# Patient Record
Sex: Female | Born: 1970 | Race: White | Hispanic: No | Marital: Married | Smoking: Never smoker
Health system: Southern US, Community
[De-identification: ages and names within clinical notes are randomized; demographics above are authoritative.]

## PROBLEM LIST (undated history)

## (undated) DIAGNOSIS — C4490 Unspecified malignant neoplasm of skin, unspecified: Secondary | ICD-10-CM

## (undated) DIAGNOSIS — F32A Depression, unspecified: Secondary | ICD-10-CM

## (undated) DIAGNOSIS — N809 Endometriosis, unspecified: Secondary | ICD-10-CM

## (undated) DIAGNOSIS — F514 Sleep terrors [night terrors]: Secondary | ICD-10-CM

## (undated) DIAGNOSIS — F410 Panic disorder [episodic paroxysmal anxiety] without agoraphobia: Secondary | ICD-10-CM

## (undated) DIAGNOSIS — F419 Anxiety disorder, unspecified: Secondary | ICD-10-CM

## (undated) DIAGNOSIS — IMO0002 Reserved for concepts with insufficient information to code with codable children: Secondary | ICD-10-CM

## (undated) DIAGNOSIS — F4310 Post-traumatic stress disorder, unspecified: Secondary | ICD-10-CM

## (undated) DIAGNOSIS — F449 Dissociative and conversion disorder, unspecified: Secondary | ICD-10-CM

## (undated) DIAGNOSIS — F329 Major depressive disorder, single episode, unspecified: Secondary | ICD-10-CM

## (undated) DIAGNOSIS — M259 Joint disorder, unspecified: Secondary | ICD-10-CM

## (undated) DIAGNOSIS — G43909 Migraine, unspecified, not intractable, without status migrainosus: Secondary | ICD-10-CM

## (undated) DIAGNOSIS — M779 Enthesopathy, unspecified: Secondary | ICD-10-CM

## (undated) DIAGNOSIS — M5430 Sciatica, unspecified side: Secondary | ICD-10-CM

## (undated) HISTORY — DX: Migraine, unspecified, not intractable, without status migrainosus: G43.909

## (undated) HISTORY — DX: Post-traumatic stress disorder, unspecified: F43.10

## (undated) HISTORY — DX: Sciatica, unspecified side: M54.30

## (undated) HISTORY — DX: Depression, unspecified: F32.A

## (undated) HISTORY — DX: Major depressive disorder, single episode, unspecified: F32.9

## (undated) HISTORY — DX: Joint disorder, unspecified: M25.9

## (undated) HISTORY — DX: Dissociative and conversion disorder, unspecified: F44.9

## (undated) HISTORY — DX: Panic disorder (episodic paroxysmal anxiety): F41.0

## (undated) HISTORY — PX: HAND SURGERY: SHX662

## (undated) HISTORY — DX: Anxiety disorder, unspecified: F41.9

## (undated) HISTORY — DX: Endometriosis, unspecified: N80.9

## (undated) HISTORY — DX: Sleep terrors (night terrors): F51.4

## (undated) HISTORY — DX: Reserved for concepts with insufficient information to code with codable children: IMO0002

## (undated) HISTORY — DX: Unspecified malignant neoplasm of skin, unspecified: C44.90

## (undated) HISTORY — DX: Enthesopathy, unspecified: M77.9

---

## 1998-11-23 ENCOUNTER — Other Ambulatory Visit: Admission: RE | Admit: 1998-11-23 | Discharge: 1998-11-23 | Payer: Self-pay | Admitting: Obstetrics & Gynecology

## 1999-12-08 ENCOUNTER — Other Ambulatory Visit: Admission: RE | Admit: 1999-12-08 | Discharge: 1999-12-08 | Payer: Self-pay | Admitting: Obstetrics & Gynecology

## 2000-04-04 HISTORY — PX: TUBAL LIGATION: SHX77

## 2000-04-25 ENCOUNTER — Inpatient Hospital Stay (HOSPITAL_COMMUNITY): Admission: AD | Admit: 2000-04-25 | Discharge: 2000-04-25 | Payer: Self-pay | Admitting: Obstetrics and Gynecology

## 2000-06-08 ENCOUNTER — Inpatient Hospital Stay (HOSPITAL_COMMUNITY): Admission: AD | Admit: 2000-06-08 | Discharge: 2000-06-08 | Payer: Self-pay | Admitting: Obstetrics and Gynecology

## 2000-06-16 ENCOUNTER — Inpatient Hospital Stay (HOSPITAL_COMMUNITY): Admission: AD | Admit: 2000-06-16 | Discharge: 2000-06-19 | Payer: Self-pay | Admitting: Obstetrics and Gynecology

## 2000-07-27 ENCOUNTER — Other Ambulatory Visit: Admission: RE | Admit: 2000-07-27 | Discharge: 2000-07-27 | Payer: Self-pay | Admitting: Obstetrics & Gynecology

## 2001-08-30 ENCOUNTER — Other Ambulatory Visit: Admission: RE | Admit: 2001-08-30 | Discharge: 2001-08-30 | Payer: Self-pay | Admitting: Obstetrics & Gynecology

## 2002-03-16 ENCOUNTER — Inpatient Hospital Stay (HOSPITAL_COMMUNITY): Admission: AD | Admit: 2002-03-16 | Discharge: 2002-03-16 | Payer: Self-pay | Admitting: Obstetrics and Gynecology

## 2002-03-16 ENCOUNTER — Encounter: Payer: Self-pay | Admitting: Obstetrics and Gynecology

## 2003-01-03 ENCOUNTER — Emergency Department (HOSPITAL_COMMUNITY): Admission: EM | Admit: 2003-01-03 | Discharge: 2003-01-03 | Payer: Self-pay | Admitting: Emergency Medicine

## 2003-01-14 ENCOUNTER — Other Ambulatory Visit: Admission: RE | Admit: 2003-01-14 | Discharge: 2003-01-14 | Payer: Self-pay | Admitting: Obstetrics & Gynecology

## 2003-01-14 ENCOUNTER — Inpatient Hospital Stay (HOSPITAL_COMMUNITY): Admission: EM | Admit: 2003-01-14 | Discharge: 2003-01-15 | Payer: Self-pay | Admitting: Psychiatry

## 2003-01-20 ENCOUNTER — Other Ambulatory Visit (HOSPITAL_COMMUNITY): Admission: RE | Admit: 2003-01-20 | Discharge: 2003-04-20 | Payer: Self-pay | Admitting: Psychiatry

## 2003-04-21 ENCOUNTER — Encounter: Payer: Self-pay | Admitting: Emergency Medicine

## 2003-04-21 ENCOUNTER — Inpatient Hospital Stay (HOSPITAL_COMMUNITY): Admission: EM | Admit: 2003-04-21 | Discharge: 2003-04-23 | Payer: Self-pay | Admitting: Psychiatry

## 2003-07-29 ENCOUNTER — Other Ambulatory Visit (HOSPITAL_COMMUNITY): Admission: RE | Admit: 2003-07-29 | Discharge: 2003-08-12 | Payer: Self-pay | Admitting: Psychiatry

## 2004-04-19 ENCOUNTER — Other Ambulatory Visit: Admission: RE | Admit: 2004-04-19 | Discharge: 2004-04-19 | Payer: Self-pay | Admitting: Obstetrics & Gynecology

## 2004-04-19 ENCOUNTER — Other Ambulatory Visit: Admission: RE | Admit: 2004-04-19 | Discharge: 2004-04-19 | Payer: Self-pay | Admitting: Obstetrics and Gynecology

## 2005-05-11 ENCOUNTER — Other Ambulatory Visit: Admission: RE | Admit: 2005-05-11 | Discharge: 2005-05-11 | Payer: Self-pay | Admitting: Obstetrics & Gynecology

## 2006-03-20 ENCOUNTER — Emergency Department (HOSPITAL_COMMUNITY): Admission: EM | Admit: 2006-03-20 | Discharge: 2006-03-20 | Payer: Self-pay | Admitting: Emergency Medicine

## 2006-03-21 ENCOUNTER — Emergency Department (HOSPITAL_COMMUNITY): Admission: EM | Admit: 2006-03-21 | Discharge: 2006-03-21 | Payer: Self-pay | Admitting: Emergency Medicine

## 2007-10-09 ENCOUNTER — Emergency Department (HOSPITAL_COMMUNITY): Admission: EM | Admit: 2007-10-09 | Discharge: 2007-10-09 | Payer: Self-pay | Admitting: Emergency Medicine

## 2007-10-24 ENCOUNTER — Encounter: Admission: RE | Admit: 2007-10-24 | Discharge: 2007-10-24 | Payer: Self-pay | Admitting: Obstetrics & Gynecology

## 2009-01-12 ENCOUNTER — Emergency Department (HOSPITAL_COMMUNITY): Admission: EM | Admit: 2009-01-12 | Discharge: 2009-01-12 | Payer: Self-pay | Admitting: Emergency Medicine

## 2009-04-04 HISTORY — PX: RADICAL HYSTERECTOMY: SHX2283

## 2009-09-22 ENCOUNTER — Other Ambulatory Visit: Admission: RE | Admit: 2009-09-22 | Discharge: 2009-09-22 | Payer: Self-pay | Admitting: Radiology

## 2010-04-04 HISTORY — PX: BREAST BIOPSY: SHX20

## 2010-04-25 ENCOUNTER — Encounter: Payer: Self-pay | Admitting: Specialist

## 2010-04-25 ENCOUNTER — Encounter: Payer: Self-pay | Admitting: Obstetrics & Gynecology

## 2010-07-18 ENCOUNTER — Emergency Department (HOSPITAL_COMMUNITY)
Admission: EM | Admit: 2010-07-18 | Discharge: 2010-07-18 | Disposition: A | Discharge status: Home / Self Care | Payer: Medicare Other | Attending: Emergency Medicine | Admitting: Emergency Medicine

## 2010-07-18 DIAGNOSIS — J3489 Other specified disorders of nose and nasal sinuses: Secondary | ICD-10-CM | POA: Insufficient documentation

## 2010-07-18 DIAGNOSIS — R51 Headache: Secondary | ICD-10-CM | POA: Insufficient documentation

## 2010-07-18 DIAGNOSIS — Z79899 Other long term (current) drug therapy: Secondary | ICD-10-CM | POA: Insufficient documentation

## 2010-07-18 DIAGNOSIS — H53149 Visual discomfort, unspecified: Secondary | ICD-10-CM | POA: Insufficient documentation

## 2010-07-18 DIAGNOSIS — R070 Pain in throat: Secondary | ICD-10-CM | POA: Insufficient documentation

## 2010-07-18 DIAGNOSIS — M542 Cervicalgia: Secondary | ICD-10-CM | POA: Insufficient documentation

## 2010-07-18 DIAGNOSIS — J329 Chronic sinusitis, unspecified: Secondary | ICD-10-CM | POA: Insufficient documentation

## 2010-07-18 DIAGNOSIS — F3130 Bipolar disorder, current episode depressed, mild or moderate severity, unspecified: Secondary | ICD-10-CM | POA: Insufficient documentation
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
## 2010-08-20 NOTE — Discharge Summary (Signed)
NAMEZIARE, ORRICK                          ACCOUNT NO.:  [id number]   MEDICAL RECORD NO.:  [id number]                   PATIENT TYPE:  IPS   LOCATION:  0503                                 FACILITY:  [facility]   PHYSICIAN:  Geoffery Lyons, M.D.                   DATE OF BIRTH:  19-Nov-1970   DATE OF ADMISSION:  04/21/2003  DATE OF DISCHARGE:  04/23/2003                                 DISCHARGE SUMMARY   CHIEF COMPLAINT AND PRESENT ILLNESS:  This was the second admission to [facility] for this 40 year old separated white female  involuntarily committed.  She apparently was taken to the emergency room.  She stated there that she was pregnant when she was not.  Tried to leave the  emergency room.  She claimed that she feel and hit her head the day before.  She tripped.  She lost consciousness.  She was apparently taken to the  emergency room where she said she was pregnant.  Told them that it was 2001.  She was further evaluated and there were no positive findings.  Apparently,  she was under a lot of stress, getting a divorce, in a same-sex  relationship, 42 year old daughter is giving her a hard time, will not talk  with her.  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   PAST PSYCHIATRIC HISTORY:  Second time at [facility].  Admitted  January 06, 2003.  Depression, panic attacks.  Increased much since in [facility].   ALCOHOL/DRUG HISTORY:  Drinks alcohol.  Admits to use of marijuana.   PAST MEDICAL HISTORY:  Neurofibromatosis, migraines.   MEDICATIONS:  Topamax (off since December), Ambien, Xanax 0.5 mg three times  a day, Cymbalta 60 mg per day (since October).   PHYSICAL EXAMINATION:  Performed and failed to show any acute findings.   MENTAL STATUS EXAM:  Alert, cooperative female with good eye contact.  Speech was clear.  Goal-oriented.  Normal tempo and production.  Mood  anxious while being in the hospital.  Affect anxious, wanting to leave.  Thought processes  logical, coherent and relevant.  No evidence of delusional  ideas, although admits that she said she was pregnant.  Cognition well-  preserved.   ADMISSION DIAGNOSES:   AXIS I:  1. Rule out mood disorder not otherwise specified.  2. Rule out marijuana and alcohol abuse.  3. Rule out psychotic disorder not otherwise specified.   AXIS II:  No diagnosis.   AXIS III:  1. Migraines.  2. Neurofibromatosis.   AXIS IV:  Moderate.   AXIS V:  Global Assessment of Functioning upon admission 30; highest Global  Assessment of Functioning in the last year 70.   LABORATORY DATA:  Head CT scan was negative.  Drug screening positive for  benzodiazepines and marijuana.  Urine pregnancy test was negative.  Other  laboratory values were within normal limits.  HOSPITAL COURSE:  She was admitted and started intensive individual and  group psychotherapy.  She was given Ambien for sleep.  She was maintained on  the Cymbalta 30 mg per day and then increased to 60 mg, the amount she was  taking.  She was very upset because she was in the hospital.  York Spaniel she did  not need to stay.  Continued to insist that she just tripped, hit her head.  Admits that she had a difficult time during Christmas but she went to [facility] and indeed she was doing much better.  She was able to share her  stressors, financial difficulties, issues with the divorce and the daughter  and the partner.  She was able to work on Conservation officer, historic buildings, stress  management and, on April 23, 2003, she was in full contact with reality.  There were no suicidal ideation, no homicidal ideation, no hallucinations,  no delusions.  She felt she was back to herself.  Denied the use of  substances.  We went ahead and discharged to outpatient follow-up.   DISCHARGE DIAGNOSES:   AXIS I:  1. Mood disorder not otherwise specified.  2. Rule out alcohol and marijuana abuse.   AXIS II:  No diagnosis.   AXIS III:  1. Migraines.  2.  Neurofibromatosis.   AXIS IV:  Moderate.   AXIS V:  Global Assessment of Functioning upon discharge 50.   DISCHARGE MEDICATIONS:  Cymbalta 60 mg per day.   FOLLOW UP:  [facility].                                               Geoffery Lyons, M.D.    IL/MEDQ  D:  05/07/2003  T:  05/07/2003  Job:  [id number]
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
## 2011-03-10 ENCOUNTER — Other Ambulatory Visit: Payer: Self-pay | Admitting: Geriatric Medicine

## 2011-03-10 DIAGNOSIS — M545 Low back pain: Secondary | ICD-10-CM

## 2011-03-22 ENCOUNTER — Inpatient Hospital Stay: Admission: RE | Admit: 2011-03-22 | Payer: Medicare Other | Source: Ambulatory Visit

## 2011-05-11 ENCOUNTER — Ambulatory Visit (INDEPENDENT_AMBULATORY_CARE_PROVIDER_SITE_OTHER): Payer: 59 | Admitting: Family Medicine

## 2011-05-11 ENCOUNTER — Ambulatory Visit: Payer: 59

## 2011-05-11 VITALS — BP 120/72 | HR 76 | Temp 98.3°F | Resp 16 | Ht 64.5 in | Wt 164.2 lb

## 2011-05-11 DIAGNOSIS — M79671 Pain in right foot: Secondary | ICD-10-CM

## 2011-05-11 DIAGNOSIS — M79609 Pain in unspecified limb: Secondary | ICD-10-CM

## 2011-05-11 DIAGNOSIS — B078 Other viral warts: Secondary | ICD-10-CM

## 2011-05-11 DIAGNOSIS — B070 Plantar wart: Secondary | ICD-10-CM

## 2011-05-11 MED ORDER — MELOXICAM 7.5 MG PO TABS: ORAL_TABLET | ORAL | Status: DC

## 2011-05-11 NOTE — Progress Notes (Signed)
  Subjective:    Patient ID: [name], female    DOB: 07/16/1970, 41 y.o.   MRN: [id number]  HPI  (R) foot pain, progressive over the last month.  Now very difficult to bear wieight.  Noted lesion on plantar surface of (L) foot.  Patient tried to remove lesion herself without success.   Review of Systems     Objective:   Physical Exam  Constitutional: She appears well-developed and well-nourished.  Cardiovascular: Normal rate, regular rhythm and normal heart sounds.   Pulmonary/Chest: Effort normal and breath sounds normal.  Musculoskeletal:       Right foot: She exhibits tenderness (wart on plantar surface of left foot  ).    [facility] reading (PRIMARY) by  Dr. Hal Hope No fracture visible      Assessment & Plan:  Large plantar wart- (R) foot  Plantar wart paired down Mobic 7.5 mg BID prn  F/U with R. Dunn PAC 1 week

## 2011-05-11 NOTE — Progress Notes (Signed)
   Procedure Note for Cryotherapy:  Verbal consent obtained. Foot soaked in warm water bath. Lesion along right foot paired down with 10 blade. Two applications of cryotherapy applied to lesion. Lesion dressed. Wound care covered.  RTC in 10 days for repeat treatment. Patient tolerated procedure well.  Signed,  Eula Listen, PA-C 05/11/2011, 3:41 PM

## 2011-05-21 ENCOUNTER — Ambulatory Visit (INDEPENDENT_AMBULATORY_CARE_PROVIDER_SITE_OTHER): Payer: 59 | Admitting: Physician Assistant

## 2011-05-21 VITALS — BP 107/72 | HR 72 | Temp 97.7°F | Resp 16 | Ht 63.5 in | Wt 163.0 lb

## 2011-05-21 DIAGNOSIS — M79609 Pain in unspecified limb: Secondary | ICD-10-CM

## 2011-05-21 DIAGNOSIS — B078 Other viral warts: Secondary | ICD-10-CM

## 2011-05-21 DIAGNOSIS — B070 Plantar wart: Secondary | ICD-10-CM

## 2011-05-21 DIAGNOSIS — M79673 Pain in unspecified foot: Secondary | ICD-10-CM

## 2011-05-21 MED ORDER — HYDROCODONE-ACETAMINOPHEN 5-325 MG PO TABS: 1.0000 | ORAL_TABLET | Freq: Four times a day (QID) | ORAL | Status: AC | PRN

## 2011-05-21 NOTE — Progress Notes (Signed)
   Patient ID: [name] MRN: [id number], DOB: 22-Aug-1970, 41 y.o. Date of Encounter: 05/21/2011, 8:29 AM  Primary Physician: No primary provider on file.  Chief Complaint: Follow up plantar wart  HPI: 41 y.o. year old female with history below presents for follow up cryotherapy of planta wart of right foot. Complains of pain with walking. She states that she does not have a car, which requires her to walk everywhere. Otherwise she is doing well.    No past medical history on file.   Home Meds: Prior to Admission medications   Medication Sig Start Date End Date Taking? Authorizing Provider  meloxicam (MOBIC) 7.5 MG tablet 1 po daily to bid prn pain 05/11/11  Yes Eula Listen, PA-C    Allergies:  Allergies  Allergen Reactions  . Benadryl (Altaryl)     History   Social History  . Marital Status: Married    Spouse Name: N/A    Number of Children: N/A  . Years of Education: N/A   Occupational History  . Not on file.   Social History Main Topics  . Smoking status: Never Smoker   . Smokeless tobacco: Not on file  . Alcohol Use: Not on file  . Drug Use: Not on file  . Sexually Active: Not on file   Other Topics Concern  . Not on file   Social History Narrative  . No narrative on file     Review of Systems: Constitutional: negative for chills, fever, night sweats, weight changes, or fatigue  HEENT: negative for vision changes, hearing loss, congestion, rhinorrhea, ST, epistaxis, or sinus pressure Cardiovascular: negative for chest pain or palpitations Respiratory: negative for hemoptysis, wheezing, shortness of breath, or cough Abdominal: negative for abdominal pain, nausea, vomiting, diarrhea, or constipation Dermatological: negative for rash Neurologic: negative for headache, dizziness, or syncope All other systems reviewed and are otherwise negative with the exception to those above and in the HPI.   Physical Exam: Blood pressure 107/72, pulse 72, temperature 97.7  F (36.5 C), temperature source Oral, resp. rate 16, height 5' 3.5" (1.613 m), weight 163 lb (73.936 kg)., Body mass index is 28.42 kg/(m^2). General: Well developed, well nourished, in no acute distress. Head: Normocephalic, atraumatic, eyes without discharge, sclera non-icteric, nares are without discharge. Oral cavity moist.  Neck: Supple. FROM Lungs: Breathing is unlabored. Heart: Regular rate Msk:  Strength and tone normal for age. Extremities/Skin: Warm and dry. No clubbing or cyanosis. No edema. No rashes or suspicious lesions. Neuro: Alert and oriented X 3. Moves all extremities spontaneously. Gait is normal. CNII-XII grossly in tact. Psych:  Responds to questions appropriately with a normal affect.     Procedure Note for Cryotherapy:  Verbal consent obtained. Foot soaked in warm water bath. Lesion along right plantar foot paired down with 10 blade. Two applications of cryotherapy applied to lesion. Lesion dressed. Wound care covered.  RTC in 7 days Patient tolerated procedure well.    ASSESSMENT AND PLAN:  41 y.o. year old female with plantar wart right foot. -Cryotherapy per above -Norco 5/325 mg #30 1 po q 6 hours prn pain no RF SED -Recheck 1 week -RTC precautions   Signed, Eula Listen, PA-C 05/21/2011 8:29 AM

## 2011-05-28 ENCOUNTER — Ambulatory Visit (INDEPENDENT_AMBULATORY_CARE_PROVIDER_SITE_OTHER): Payer: 59 | Admitting: Physician Assistant

## 2011-05-28 VITALS — BP 107/71 | HR 71 | Temp 97.8°F | Resp 16 | Ht 65.5 in | Wt 169.0 lb

## 2011-05-28 DIAGNOSIS — A630 Anogenital (venereal) warts: Secondary | ICD-10-CM

## 2011-05-28 DIAGNOSIS — B070 Plantar wart: Secondary | ICD-10-CM

## 2011-05-28 NOTE — Progress Notes (Signed)
Patient ID: [name] MRN: [id number], DOB: 1970/11/10, 41 y.o. Date of Encounter: 05/28/2011, 10:15 AM  Primary Physician: No primary provider on file.  Chief Complaint: Follow up cryotherapy   HPI: 41 y.o. year old female with history below presents for follow up cryotherapy of plantar wart of right foot. Pain improving when she walks. Developed a blister after last session. 3 days prior this ruptured causing a considerable improvement with her pain when ambulating. No erythema, swelling, or pain. Afebrile.   No past medical history on file.   Home Meds: Prior to Admission medications   Medication Sig Start Date End Date Taking? Authorizing Provider  HYDROcodone-acetaminophen (NORCO) 5-325 MG per tablet Take 1 tablet by mouth every 6 (six) hours as needed for pain. 05/21/11 05/31/11 Yes Sobia Karger, PA-C  meloxicam (MOBIC) 7.5 MG tablet 1 po daily to bid prn pain 05/11/11  Yes Eula Listen, PA-C    Allergies:  Allergies  Allergen Reactions  . Benadryl (Altaryl)     History   Social History  . Marital Status: Married    Spouse Name: N/A    Number of Children: N/A  . Years of Education: N/A   Occupational History  . Not on file.   Social History Main Topics  . Smoking status: Never Smoker   . Smokeless tobacco: Not on file  . Alcohol Use: Not on file  . Drug Use: Not on file  . Sexually Active: Not on file   Other Topics Concern  . Not on file   Social History Narrative  . No narrative on file     Review of Systems: Constitutional: negative for chills, fever, night sweats, weight changes, or fatigue  HEENT: negative for vision changes, hearing loss, congestion, rhinorrhea, ST, epistaxis, or sinus pressure Cardiovascular: negative for chest pain or palpitations Respiratory: negative for hemoptysis, wheezing, shortness of breath, or cough Abdominal: negative for abdominal pain, nausea, vomiting, diarrhea, or constipation Dermatological: negative for rash Neurologic:  negative for headache, dizziness, or syncope All other systems reviewed and are otherwise negative with the exception to those above and in the HPI.   Physical Exam: Blood pressure 107/71, pulse 71, temperature 97.8 F (36.6 C), temperature source Oral, resp. rate 16, height 5' 5.5" (1.664 m), weight 169 lb (76.658 kg)., Body mass index is 27.70 kg/(m^2). General: Well developed, well nourished, in no acute distress. Head: Normocephalic, atraumatic, eyes without discharge, sclera non-icteric, nares are without discharge. Bilateral auditory canals clear, TM's are without perforation, pearly grey and translucent with reflective cone of light bilaterally. Oral cavity moist, posterior pharynx without exudate, erythema, peritonsillar abscess, or post nasal drip.  Neck: Supple. No thyromegaly. Full ROM. No lymphadenopathy. Lungs: Breathing is unlabored. Heart: Normal rate. Msk:  Strength and tone normal for age. Extremities/Skin: Warm and dry. No clubbing or cyanosis. No edema. No rashes or suspicious lesions. Resolving plantar wart right foot. No erythema. Mild TTP superior aspect.  Neuro: Alert and oriented X 3. Moves all extremities spontaneously. Gait is normal. CNII-XII grossly in tact. Psych:  Responds to questions appropriately with a normal affect.     Procedure Note for Cryotherapy:  Verbal consent obtained. Foot soaked in warm water bath. Lesion along right foot paired down with 10 blade. Two applications of cryotherapy applied to lesion. Lesion dressed. Wound care covered.  RTC in 1 week if not resolved Patient tolerated procedure well.   ASSESSMENT AND PLAN:  41 y.o. year old female with resolving plantar wart right foot. -Cryotherapy per  above -RTC 1 week if not resolved -RTC precautions  Signed, Eula Listen, PA-C 05/28/2011 10:15 AM

## 2011-06-04 ENCOUNTER — Ambulatory Visit (INDEPENDENT_AMBULATORY_CARE_PROVIDER_SITE_OTHER): Payer: 59 | Admitting: Physician Assistant

## 2011-06-04 VITALS — BP 124/77 | HR 74 | Temp 98.5°F | Resp 16 | Ht 65.5 in | Wt 169.0 lb

## 2011-06-04 DIAGNOSIS — M79609 Pain in unspecified limb: Secondary | ICD-10-CM

## 2011-06-04 NOTE — Progress Notes (Signed)
  Subjective:    Patient ID: [name], female    DOB: [birth year], 41 y.o.   MRN: [id number]  HPI REcheck plantar's wart.  Frozen and curretted X 1 week ago. Having to walk everywhere bc no car.   Review of Systems  All other systems reviewed and are negative.       Objective:   Physical Exam R foot plantar aspect. Inflamed skin w central ulceration where the wart body was.          Assessment & Plan:  Weight displacement bandages.  Begin compound-w once healed.

## 2012-08-07 ENCOUNTER — Institutional Professional Consult (permissible substitution): Payer: Medicare Other | Admitting: Diagnostic Neuroimaging

## 2012-08-22 ENCOUNTER — Institutional Professional Consult (permissible substitution): Payer: Medicare Other | Admitting: Diagnostic Neuroimaging

## 2012-09-21 ENCOUNTER — Other Ambulatory Visit: Payer: Self-pay | Admitting: Obstetrics & Gynecology

## 2012-09-21 DIAGNOSIS — N644 Mastodynia: Secondary | ICD-10-CM

## 2012-10-03 ENCOUNTER — Ambulatory Visit
Admission: RE | Admit: 2012-10-03 | Discharge: 2012-10-03 | Disposition: A | Discharge status: Home / Self Care | Payer: 59 | Source: Ambulatory Visit | Attending: Obstetrics & Gynecology | Admitting: Obstetrics & Gynecology

## 2012-10-03 DIAGNOSIS — N644 Mastodynia: Secondary | ICD-10-CM

## 2012-10-23 ENCOUNTER — Ambulatory Visit: Payer: Medicare Other | Admitting: Diagnostic Neuroimaging

## 2012-11-09 ENCOUNTER — Ambulatory Visit (INDEPENDENT_AMBULATORY_CARE_PROVIDER_SITE_OTHER): Payer: 59 | Admitting: Diagnostic Neuroimaging

## 2012-11-09 ENCOUNTER — Encounter: Payer: Self-pay | Admitting: Diagnostic Neuroimaging

## 2012-11-09 VITALS — BP 101/78 | HR 81 | Temp 98.2°F | Ht 65.5 in | Wt 169.0 lb

## 2012-11-09 DIAGNOSIS — Q8500 Neurofibromatosis, unspecified: Secondary | ICD-10-CM | POA: Insufficient documentation

## 2012-11-09 NOTE — Patient Instructions (Signed)
I will refer you to [facility] clinic.
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
## 2012-11-13 ENCOUNTER — Telehealth: Payer: Self-pay | Admitting: Diagnostic Neuroimaging

## 2012-11-16 ENCOUNTER — Telehealth: Payer: Self-pay | Admitting: Diagnostic Neuroimaging

## 2012-11-16 NOTE — Telephone Encounter (Signed)
Patient called to check the status of referral to [facility]. Explained process.

## 2013-04-30 ENCOUNTER — Other Ambulatory Visit: Payer: Self-pay

## 2013-05-13 ENCOUNTER — Ambulatory Visit: Payer: Medicare Other | Admitting: Diagnostic Neuroimaging

## 2013-06-18 ENCOUNTER — Other Ambulatory Visit: Payer: Self-pay | Admitting: Obstetrics and Gynecology

## 2013-06-18 DIAGNOSIS — N644 Mastodynia: Secondary | ICD-10-CM

## 2013-06-27 ENCOUNTER — Ambulatory Visit
Admission: RE | Admit: 2013-06-27 | Discharge: 2013-06-27 | Disposition: A | Discharge status: Home / Self Care | Payer: 59 | Source: Ambulatory Visit | Attending: Obstetrics and Gynecology | Admitting: Obstetrics and Gynecology

## 2013-06-27 DIAGNOSIS — N644 Mastodynia: Secondary | ICD-10-CM

## 2013-07-23 ENCOUNTER — Ambulatory Visit: Payer: 59 | Admitting: Diagnostic Neuroimaging

## 2014-03-24 ENCOUNTER — Telehealth: Payer: Self-pay | Admitting: *Deleted

## 2014-03-24 NOTE — Telephone Encounter (Signed)
Open error 

## 2015-02-08 ENCOUNTER — Emergency Department (HOSPITAL_COMMUNITY): Payer: Medicare Other

## 2015-02-08 ENCOUNTER — Emergency Department (HOSPITAL_COMMUNITY)
Admission: EM | Admit: 2015-02-08 | Discharge: 2015-02-09 | Disposition: A | Discharge status: Home / Self Care | Payer: Medicare Other | Attending: Physician Assistant | Admitting: Physician Assistant

## 2015-02-08 ENCOUNTER — Encounter (HOSPITAL_COMMUNITY): Payer: Self-pay | Admitting: Nurse Practitioner

## 2015-02-08 DIAGNOSIS — Z8742 Personal history of other diseases of the female genital tract: Secondary | ICD-10-CM | POA: Diagnosis not present

## 2015-02-08 DIAGNOSIS — Z85828 Personal history of other malignant neoplasm of skin: Secondary | ICD-10-CM | POA: Diagnosis not present

## 2015-02-08 DIAGNOSIS — J111 Influenza due to unidentified influenza virus with other respiratory manifestations: Secondary | ICD-10-CM | POA: Diagnosis not present

## 2015-02-08 DIAGNOSIS — G43809 Other migraine, not intractable, without status migrainosus: Secondary | ICD-10-CM | POA: Diagnosis not present

## 2015-02-08 DIAGNOSIS — Z8739 Personal history of other diseases of the musculoskeletal system and connective tissue: Secondary | ICD-10-CM | POA: Insufficient documentation

## 2015-02-08 DIAGNOSIS — Z8659 Personal history of other mental and behavioral disorders: Secondary | ICD-10-CM | POA: Diagnosis not present

## 2015-02-08 DIAGNOSIS — Z87891 Personal history of nicotine dependence: Secondary | ICD-10-CM | POA: Diagnosis not present

## 2015-02-08 DIAGNOSIS — R52 Pain, unspecified: Secondary | ICD-10-CM | POA: Diagnosis present

## 2015-02-08 DIAGNOSIS — R69 Illness, unspecified: Secondary | ICD-10-CM

## 2015-02-08 LAB — RAPID STREP SCREEN (MED CTR MEBANE ONLY): Streptococcus, Group A Screen (Direct): NEGATIVE

## 2015-02-08 MED ORDER — KETOROLAC TROMETHAMINE 30 MG/ML IJ SOLN
30.0000 mg | Freq: Once | INTRAMUSCULAR | Status: AC
  Administered 2015-02-08: 30 mg via INTRAVENOUS
  Filled 2015-02-08: qty 1

## 2015-02-08 MED ORDER — PROCHLORPERAZINE EDISYLATE 5 MG/ML IJ SOLN
10.0000 mg | Freq: Once | INTRAMUSCULAR | Status: AC
  Administered 2015-02-08: 10 mg via INTRAVENOUS
  Filled 2015-02-08: qty 2

## 2015-02-08 MED ORDER — GUAIFENESIN ER 1200 MG PO TB12: 1.0000 | ORAL_TABLET | Freq: Two times a day (BID) | ORAL | Status: DC

## 2015-02-08 MED ORDER — IBUPROFEN 800 MG PO TABS: 800.0000 mg | ORAL_TABLET | Freq: Three times a day (TID) | ORAL | Status: DC | PRN

## 2015-02-08 MED ORDER — SODIUM CHLORIDE 0.9 % IV BOLUS (SEPSIS)
1000.0000 mL | Freq: Once | INTRAVENOUS | Status: AC
  Administered 2015-02-08: 1000 mL via INTRAVENOUS

## 2015-02-08 MED ORDER — ACETAMINOPHEN-CODEINE 120-12 MG/5ML PO SOLN: 10.0000 mL | ORAL | Status: DC | PRN

## 2015-02-08 NOTE — ED Notes (Signed)
Pt c/o N/V, migraine headache, sore throat and generalized body aches, onset yesterday afternoon, has tried OTC remedies without getting relief.

## 2015-02-08 NOTE — ED Provider Notes (Signed)
CSN: [id number]     Arrival date & time 02/08/15  1932 History   First MD Initiated Contact with Patient 02/08/15 1954     Chief Complaint  Patient presents with  . Migraine  . N/V   . Generalized Body Aches     (Consider location/radiation/quality/duration/timing/severity/associated sxs/prior Treatment) HPI Patient presents to the emergency department with migraine headache, cough, runny nose, sore throat and light sensitivity.  The patient states that she also had some nausea and vomiting.  Patient states she took Goody's powder before coming to the emergency department with no relief of her symptoms.  The patient states that she does not have any chest pain, shortness of breath, weakness, dizziness, blurred vision, neck pain, numbness, dysuria, incontinence, rash, abdominal pain, hematuria, bloody stool, hematemesis, or syncope.  The patient states that she took no other medications prior to arrival.  She said nothing seems make her condition better or worse Past Medical History  Diagnosis Date  . Depression   . PTSD (post-traumatic stress disorder)   . Night terrors   . Dissociative disorder   . Panic disorder (episodic paroxysmal anxiety)   . Bulging discs     neck/back  . Sciatica   . Tendonitis   . Knee problem     Left  . Endometriosis   . Migraine   . Anxiety   . Skin cancer    Past Surgical History  Procedure Laterality Date  . Tubal ligation  2002  . Radical hysterectomy  2011  . Hand surgery Left     Grafting  . Breast biopsy Left 2012   Family History  Problem Relation Age of Onset  . Cancer Mother   . Leukemia Father   . Esophageal cancer    . Lung cancer    . Stroke    . Hypertension    . ALS    . Macular degeneration     Social History  Substance Use Topics  . Smoking status: Former Smoker -- 0.20 packs/day for 6 years    Types: Cigarettes    Quit date: 04/05/2011  . Smokeless tobacco: Never Used  . Alcohol Use: No   OB History    No data  available     Review of Systems All other systems negative except as documented in the HPI. All pertinent positives and negatives as reviewed in the HPI.=   Allergies  Benadryl  Home Medications   Prior to Admission medications   Medication Sig Start Date End Date Taking? Authorizing Provider  ibuprofen (ADVIL,MOTRIN) 200 MG tablet Take 200 mg by mouth 3 (three) times daily as needed for pain.    Historical Provider, MD  meloxicam (MOBIC) 7.5 MG tablet 1 po daily to bid prn pain 05/11/11   Areta Haber Dunn, PA-C  zolpidem (AMBIEN) 10 MG tablet Take 10 mg by mouth at bedtime as needed for sleep.    Historical Provider, MD   BP 116/73 mmHg  Pulse 75  Temp(Src) 98.5 F (36.9 C) (Oral)  Resp 19  Ht 5\' 5"  (1.651 m)  Wt 160 lb (72.576 kg)  BMI 26.63 kg/m2  SpO2 98% Physical Exam  Constitutional: She is oriented to person, place, and time. She appears well-developed and well-nourished. No distress.  HENT:  Head: Normocephalic and atraumatic.  Mouth/Throat: Oropharynx is clear and moist.  Eyes: Pupils are equal, round, and reactive to light.  Neck: Normal range of motion. Neck supple.  Cardiovascular: Normal rate, regular rhythm and normal heart sounds.  Exam reveals no gallop and no friction rub.   No murmur heard. Pulmonary/Chest: Effort normal and breath sounds normal. No respiratory distress. She has no wheezes.  Abdominal: Soft. Bowel sounds are normal. She exhibits no distension. There is no tenderness.  Neurological: She is alert and oriented to person, place, and time. She exhibits normal muscle tone. Coordination normal.  Skin: Skin is warm and dry. No rash noted. No erythema.  Psychiatric: She has a normal mood and affect. Her behavior is normal.  Nursing note and vitals reviewed.   ED Course  Procedures (including critical care time) Labs Review Labs Reviewed - No data to display  Imaging Review No results found. I have personally reviewed and evaluated these images and  lab results as part of my medical decision-making.   Patient is feeling better following IV fluids and medications.  Patient will be discharged home, told to return here as needed.  Feel like the patient most likely has an upper respiratory flulike illness  Dalia Heading, PA-C 02/08/15 [place], MD 02/12/15 [phone]

## 2015-02-08 NOTE — ED Notes (Signed)
Pt states that she began yesterday with a sore throat that progressed to a sinus headache which progressed to her current migraine with N/V and light sensitivity.

## 2015-02-08 NOTE — Discharge Instructions (Signed)
Return here as needed.  Increase your fluid intake, rest as much as possible °

## 2015-02-08 NOTE — ED Notes (Signed)
Patient transported to X-ray 

## 2015-02-11 LAB — CULTURE, GROUP A STREP: Strep A Culture: NEGATIVE

## 2015-05-05 ENCOUNTER — Encounter (HOSPITAL_COMMUNITY): Payer: Self-pay | Admitting: Emergency Medicine

## 2015-05-05 ENCOUNTER — Emergency Department (HOSPITAL_COMMUNITY)
Admission: EM | Admit: 2015-05-05 | Discharge: 2015-05-05 | Disposition: A | Discharge status: Home / Self Care | Payer: Medicare Other | Attending: Physician Assistant | Admitting: Physician Assistant

## 2015-05-05 ENCOUNTER — Emergency Department (HOSPITAL_COMMUNITY): Payer: Medicare Other

## 2015-05-05 DIAGNOSIS — S199XXA Unspecified injury of neck, initial encounter: Secondary | ICD-10-CM | POA: Diagnosis not present

## 2015-05-05 DIAGNOSIS — S4991XA Unspecified injury of right shoulder and upper arm, initial encounter: Secondary | ICD-10-CM | POA: Diagnosis not present

## 2015-05-05 DIAGNOSIS — Y9389 Activity, other specified: Secondary | ICD-10-CM | POA: Diagnosis not present

## 2015-05-05 DIAGNOSIS — D1809 Hemangioma of other sites: Secondary | ICD-10-CM

## 2015-05-05 DIAGNOSIS — Z85828 Personal history of other malignant neoplasm of skin: Secondary | ICD-10-CM | POA: Insufficient documentation

## 2015-05-05 DIAGNOSIS — D1800 Hemangioma unspecified site: Secondary | ICD-10-CM | POA: Insufficient documentation

## 2015-05-05 DIAGNOSIS — Z8739 Personal history of other diseases of the musculoskeletal system and connective tissue: Secondary | ICD-10-CM | POA: Diagnosis not present

## 2015-05-05 DIAGNOSIS — IMO0002 Reserved for concepts with insufficient information to code with codable children: Secondary | ICD-10-CM

## 2015-05-05 DIAGNOSIS — Y998 Other external cause status: Secondary | ICD-10-CM | POA: Insufficient documentation

## 2015-05-05 DIAGNOSIS — Z8659 Personal history of other mental and behavioral disorders: Secondary | ICD-10-CM | POA: Insufficient documentation

## 2015-05-05 DIAGNOSIS — Z79899 Other long term (current) drug therapy: Secondary | ICD-10-CM | POA: Diagnosis not present

## 2015-05-05 DIAGNOSIS — S6991XA Unspecified injury of right wrist, hand and finger(s), initial encounter: Secondary | ICD-10-CM | POA: Diagnosis not present

## 2015-05-05 DIAGNOSIS — S6992XA Unspecified injury of left wrist, hand and finger(s), initial encounter: Secondary | ICD-10-CM | POA: Diagnosis not present

## 2015-05-05 DIAGNOSIS — G43909 Migraine, unspecified, not intractable, without status migrainosus: Secondary | ICD-10-CM | POA: Insufficient documentation

## 2015-05-05 DIAGNOSIS — Z8742 Personal history of other diseases of the female genital tract: Secondary | ICD-10-CM | POA: Insufficient documentation

## 2015-05-05 DIAGNOSIS — S8991XA Unspecified injury of right lower leg, initial encounter: Secondary | ICD-10-CM | POA: Diagnosis not present

## 2015-05-05 DIAGNOSIS — S8992XA Unspecified injury of left lower leg, initial encounter: Secondary | ICD-10-CM | POA: Diagnosis not present

## 2015-05-05 DIAGNOSIS — S4992XA Unspecified injury of left shoulder and upper arm, initial encounter: Secondary | ICD-10-CM | POA: Insufficient documentation

## 2015-05-05 DIAGNOSIS — Y92410 Unspecified street and highway as the place of occurrence of the external cause: Secondary | ICD-10-CM | POA: Diagnosis not present

## 2015-05-05 DIAGNOSIS — Z87891 Personal history of nicotine dependence: Secondary | ICD-10-CM | POA: Diagnosis not present

## 2015-05-05 MED ORDER — TRAMADOL HCL 50 MG PO TABS: 50.0000 mg | ORAL_TABLET | Freq: Four times a day (QID) | ORAL | Status: DC | PRN

## 2015-05-05 MED ORDER — CYCLOBENZAPRINE HCL 10 MG PO TABS: 10.0000 mg | ORAL_TABLET | Freq: Two times a day (BID) | ORAL | Status: DC | PRN

## 2015-05-05 MED ORDER — NAPROXEN 375 MG PO TABS: 375.0000 mg | ORAL_TABLET | Freq: Two times a day (BID) | ORAL | Status: DC
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
## 2015-05-05 NOTE — Discharge Instructions (Signed)

## 2015-05-05 NOTE — Progress Notes (Signed)
Medicaid [place] access response hx indicates pcp as general medical  entered in d/c instructions  Clinic, General Medical Schedule an appointment as soon as possible for a visit This is your assigned Medicaid [place] access doctor If you prefer another contact [place] assigned your doctor *You may receive a bill if you go to any family Dr not assigned to you [place] [id number] Medicaid [place] Access Covered Patient [place]: [place], [place] [address] [email or web address] USE THIS [place] As a Medicaid client you MUST contact DSS/SSI each time you change address, move to another [place] or another state to keep your address updated [place] Medicaid Transportation to Dr appts if you are have full Medicaid: [phone], [phone]
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
## 2015-06-04 ENCOUNTER — Encounter (HOSPITAL_COMMUNITY): Payer: Self-pay | Admitting: Emergency Medicine

## 2015-06-04 ENCOUNTER — Emergency Department (HOSPITAL_COMMUNITY)
Admission: EM | Admit: 2015-06-04 | Discharge: 2015-06-04 | Disposition: A | Discharge status: Home / Self Care | Payer: Medicare Other | Source: Home / Self Care

## 2015-06-04 DIAGNOSIS — R197 Diarrhea, unspecified: Secondary | ICD-10-CM

## 2015-06-04 DIAGNOSIS — R1110 Vomiting, unspecified: Secondary | ICD-10-CM | POA: Insufficient documentation

## 2015-06-04 DIAGNOSIS — A09 Infectious gastroenteritis and colitis, unspecified: Secondary | ICD-10-CM | POA: Diagnosis not present

## 2015-06-04 DIAGNOSIS — R109 Unspecified abdominal pain: Secondary | ICD-10-CM | POA: Insufficient documentation

## 2015-06-04 DIAGNOSIS — K921 Melena: Secondary | ICD-10-CM | POA: Diagnosis not present

## 2015-06-04 DIAGNOSIS — R61 Generalized hyperhidrosis: Secondary | ICD-10-CM

## 2015-06-04 LAB — TYPE AND SCREEN
ABO/RH(D): O POS
Antibody Screen: NEGATIVE

## 2015-06-04 LAB — COMPREHENSIVE METABOLIC PANEL
ALBUMIN: 4.3 g/dL (ref 3.5–5.0)
ALK PHOS: 61 U/L (ref 38–126)
ALT: 14 U/L (ref 14–54)
AST: 18 U/L (ref 15–41)
Anion gap: 11 (ref 5–15)
BUN: 17 mg/dL (ref 6–20)
CALCIUM: 9.6 mg/dL (ref 8.9–10.3)
CO2: 22 mmol/L (ref 22–32)
CREATININE: 0.68 mg/dL (ref 0.44–1.00)
Chloride: 102 mmol/L (ref 101–111)
GFR calc non Af Amer: >60 mL/min (ref >60)
GLUCOSE: 121 mg/dL — AB (ref 65–99)
Potassium: 3.8 mmol/L (ref 3.5–5.1)
SODIUM: 135 mmol/L (ref 135–145)
Total Bilirubin: 1.3 mg/dL — ABNORMAL HIGH (ref 0.3–1.2)
Total Protein: 7 g/dL (ref 6.5–8.1)

## 2015-06-04 LAB — CBC
HCT: 41.4 % (ref 36.0–46.0)
Hemoglobin: 14.1 g/dL (ref 12.0–15.0)
MCH: 30.3 pg (ref 26.0–34.0)
MCHC: 34.1 g/dL (ref 30.0–36.0)
MCV: 88.8 fL (ref 78.0–100.0)
PLATELETS: 273 10*3/uL (ref 150–400)
RBC: 4.66 MIL/uL (ref 3.87–5.11)
RDW: 12.8 % (ref 11.5–15.5)
WBC: 15.8 10*3/uL — ABNORMAL HIGH (ref 4.0–10.5)

## 2015-06-04 LAB — ABO/RH: ABO/RH(D): O POS

## 2015-06-04 LAB — LIPASE, BLOOD: Lipase: 20 U/L (ref 11–51)

## 2015-06-04 NOTE — ED Notes (Addendum)
Pt c/o abdominal pain, diaphoresis, emesis, diarrhea with blood onset last night after eating at [employer] in [place]. Pt took Tylenol 2 hours ago. Reports fevers of 102 F yesterday.

## 2015-06-04 NOTE — ED Notes (Addendum)
Patient upset about the wait, says she does not want to wait to be seen anymore and wanted to lay down, this nurse offered to make patient comfortable in a triage room until could be seen by MD and patient stormed out of the doors and stated "I am going home"

## 2015-06-04 NOTE — ED Notes (Signed)
Pt called for triage, no response. Per registration, pt informed registration that she may be in bathroom due to diarrhea.

## 2015-06-05 ENCOUNTER — Inpatient Hospital Stay (HOSPITAL_COMMUNITY): Payer: Medicare Other

## 2015-06-05 ENCOUNTER — Encounter (HOSPITAL_BASED_OUTPATIENT_CLINIC_OR_DEPARTMENT_OTHER): Payer: Self-pay | Admitting: *Deleted

## 2015-06-05 ENCOUNTER — Inpatient Hospital Stay (HOSPITAL_BASED_OUTPATIENT_CLINIC_OR_DEPARTMENT_OTHER)
Admission: EM | Admit: 2015-06-05 | Discharge: 2015-06-08 | DRG: 392 | Disposition: A | Discharge status: Home / Self Care | Payer: Medicare Other | Attending: Internal Medicine | Admitting: Internal Medicine

## 2015-06-05 DIAGNOSIS — R519 Headache, unspecified: Secondary | ICD-10-CM | POA: Diagnosis present

## 2015-06-05 DIAGNOSIS — R51 Headache: Secondary | ICD-10-CM | POA: Diagnosis not present

## 2015-06-05 DIAGNOSIS — A09 Infectious gastroenteritis and colitis, unspecified: Secondary | ICD-10-CM | POA: Diagnosis not present

## 2015-06-05 DIAGNOSIS — F329 Major depressive disorder, single episode, unspecified: Secondary | ICD-10-CM | POA: Diagnosis present

## 2015-06-05 DIAGNOSIS — D62 Acute posthemorrhagic anemia: Secondary | ICD-10-CM | POA: Diagnosis present

## 2015-06-05 DIAGNOSIS — K922 Gastrointestinal hemorrhage, unspecified: Secondary | ICD-10-CM

## 2015-06-05 DIAGNOSIS — Z87891 Personal history of nicotine dependence: Secondary | ICD-10-CM

## 2015-06-05 DIAGNOSIS — D72829 Elevated white blood cell count, unspecified: Secondary | ICD-10-CM | POA: Diagnosis present

## 2015-06-05 DIAGNOSIS — K921 Melena: Secondary | ICD-10-CM | POA: Diagnosis not present

## 2015-06-05 DIAGNOSIS — Z79899 Other long term (current) drug therapy: Secondary | ICD-10-CM | POA: Diagnosis not present

## 2015-06-05 DIAGNOSIS — Z888 Allergy status to other drugs, medicaments and biological substances status: Secondary | ICD-10-CM

## 2015-06-05 DIAGNOSIS — R197 Diarrhea, unspecified: Secondary | ICD-10-CM | POA: Diagnosis present

## 2015-06-05 DIAGNOSIS — R509 Fever, unspecified: Secondary | ICD-10-CM | POA: Insufficient documentation

## 2015-06-05 DIAGNOSIS — F4310 Post-traumatic stress disorder, unspecified: Secondary | ICD-10-CM | POA: Diagnosis present

## 2015-06-05 DIAGNOSIS — F410 Panic disorder [episodic paroxysmal anxiety] without agoraphobia: Secondary | ICD-10-CM | POA: Diagnosis present

## 2015-06-05 LAB — COMPREHENSIVE METABOLIC PANEL
ALBUMIN: 3.9 g/dL (ref 3.5–5.0)
ALBUMIN: 2.9 g/dL — AB (ref 3.5–5.0)
ALT: 12 U/L — AB (ref 14–54)
ALT: 10 U/L — ABNORMAL LOW (ref 14–54)
AST: 17 U/L (ref 15–41)
AST: 14 U/L — AB (ref 15–41)
Alkaline Phosphatase: 58 U/L (ref 38–126)
Alkaline Phosphatase: 44 U/L (ref 38–126)
Anion gap: 9 (ref 5–15)
Anion gap: 10 (ref 5–15)
BUN: 14 mg/dL (ref 6–20)
BUN: 9 mg/dL (ref 6–20)
CHLORIDE: 102 mmol/L (ref 101–111)
CHLORIDE: 106 mmol/L (ref 101–111)
CO2: 27 mmol/L (ref 22–32)
CO2: 21 mmol/L — AB (ref 22–32)
CREATININE: 0.75 mg/dL (ref 0.44–1.00)
Calcium: 8.8 mg/dL — ABNORMAL LOW (ref 8.9–10.3)
Calcium: 7.7 mg/dL — ABNORMAL LOW (ref 8.9–10.3)
Creatinine, Ser: 0.7 mg/dL (ref 0.44–1.00)
GFR calc Af Amer: >60 mL/min (ref >60)
GFR calc non Af Amer: >60 mL/min (ref >60)
GLUCOSE: 94 mg/dL (ref 65–99)
Glucose, Bld: 114 mg/dL — ABNORMAL HIGH (ref 65–99)
POTASSIUM: 3.6 mmol/L (ref 3.5–5.1)
POTASSIUM: 3.5 mmol/L (ref 3.5–5.1)
SODIUM: 138 mmol/L (ref 135–145)
SODIUM: 137 mmol/L (ref 135–145)
Total Bilirubin: 1.3 mg/dL — ABNORMAL HIGH (ref 0.3–1.2)
Total Bilirubin: 0.9 mg/dL (ref 0.3–1.2)
Total Protein: 6.4 g/dL — ABNORMAL LOW (ref 6.5–8.1)
Total Protein: 5.2 g/dL — ABNORMAL LOW (ref 6.5–8.1)

## 2015-06-05 LAB — IRON AND TIBC
IRON: 37 ug/dL (ref 28–170)
SATURATION RATIOS: 16 % (ref 10.4–31.8)
TIBC: 234 ug/dL — AB (ref 250–450)
UIBC: 197 ug/dL

## 2015-06-05 LAB — URINALYSIS, ROUTINE W REFLEX MICROSCOPIC
Bilirubin Urine: NEGATIVE
Glucose, UA: NEGATIVE mg/dL
HGB URINE DIPSTICK: NEGATIVE
Ketones, ur: NEGATIVE mg/dL
Leukocytes, UA: NEGATIVE
Nitrite: NEGATIVE
PROTEIN: NEGATIVE mg/dL
Specific Gravity, Urine: 1.018 (ref 1.005–1.030)
pH: 6 (ref 5.0–8.0)

## 2015-06-05 LAB — MAGNESIUM: Magnesium: 1.5 mg/dL — ABNORMAL LOW (ref 1.7–2.4)

## 2015-06-05 LAB — PREGNANCY, URINE: Preg Test, Ur: NEGATIVE

## 2015-06-05 LAB — CBC WITH DIFFERENTIAL/PLATELET
BASOS ABS: 0 10*3/uL (ref 0.0–0.1)
BASOS PCT: 0 %
EOS ABS: 0.3 10*3/uL (ref 0.0–0.7)
Eosinophils Relative: 3 %
HCT: 37.2 % (ref 36.0–46.0)
HEMOGLOBIN: 13.1 g/dL (ref 12.0–15.0)
LYMPHS ABS: 2.1 10*3/uL (ref 0.7–4.0)
Lymphocytes Relative: 17 %
MCH: 31.2 pg (ref 26.0–34.0)
MCHC: 35.2 g/dL (ref 30.0–36.0)
MCV: 88.6 fL (ref 78.0–100.0)
Monocytes Absolute: 1.1 10*3/uL — ABNORMAL HIGH (ref 0.1–1.0)
Monocytes Relative: 9 %
NEUTROS PCT: 71 %
Neutro Abs: 8.6 10*3/uL — ABNORMAL HIGH (ref 1.7–7.7)
PLATELETS: 232 10*3/uL (ref 150–400)
RBC: 4.2 MIL/uL (ref 3.87–5.11)
RDW: 12.3 % (ref 11.5–15.5)
WBC: 12.1 10*3/uL — AB (ref 4.0–10.5)

## 2015-06-05 LAB — TYPE AND SCREEN
ABO/RH(D): O POS
Antibody Screen: NEGATIVE

## 2015-06-05 LAB — CBC
HEMATOCRIT: 33.4 % — AB (ref 36.0–46.0)
HEMATOCRIT: 31.6 % — AB (ref 36.0–46.0)
HEMOGLOBIN: 11.7 g/dL — AB (ref 12.0–15.0)
Hemoglobin: 10.6 g/dL — ABNORMAL LOW (ref 12.0–15.0)
MCH: 31.2 pg (ref 26.0–34.0)
MCH: 29.8 pg (ref 26.0–34.0)
MCHC: 35 g/dL (ref 30.0–36.0)
MCHC: 33.5 g/dL (ref 30.0–36.0)
MCV: 89.1 fL (ref 78.0–100.0)
MCV: 88.8 fL (ref 78.0–100.0)
PLATELETS: 183 10*3/uL (ref 150–400)
Platelets: 194 10*3/uL (ref 150–400)
RBC: 3.75 MIL/uL — AB (ref 3.87–5.11)
RBC: 3.56 MIL/uL — ABNORMAL LOW (ref 3.87–5.11)
RDW: 12.8 % (ref 11.5–15.5)
RDW: 12.6 % (ref 11.5–15.5)
WBC: 9.8 10*3/uL (ref 4.0–10.5)
WBC: 8.3 10*3/uL (ref 4.0–10.5)

## 2015-06-05 LAB — ABO/RH: ABO/RH(D): O POS

## 2015-06-05 LAB — TSH: TSH: 7.294 u[IU]/mL — AB (ref 0.350–4.500)

## 2015-06-05 LAB — HEMOGLOBIN AND HEMATOCRIT, BLOOD
HCT: 31.7 % — ABNORMAL LOW (ref 36.0–46.0)
Hemoglobin: 10.9 g/dL — ABNORMAL LOW (ref 12.0–15.0)

## 2015-06-05 LAB — ETHANOL

## 2015-06-05 LAB — FOLATE: Folate: 33 ng/mL (ref >5.9)

## 2015-06-05 LAB — RETICULOCYTES
RBC.: 3.71 MIL/uL — ABNORMAL LOW (ref 3.87–5.11)
Retic Count, Absolute: 33.4 10*3/uL (ref 19.0–186.0)
Retic Ct Pct: 0.9 % (ref 0.4–3.1)

## 2015-06-05 LAB — FERRITIN: FERRITIN: 111 ng/mL (ref 11–307)

## 2015-06-05 LAB — PHOSPHORUS: PHOSPHORUS: 2.7 mg/dL (ref 2.5–4.6)

## 2015-06-05 LAB — VITAMIN B12: Vitamin B-12: 296 pg/mL (ref 180–914)

## 2015-06-05 MED ORDER — ONDANSETRON HCL 4 MG/2ML IJ SOLN
4.0000 mg | Freq: Once | INTRAMUSCULAR | Status: AC
  Administered 2015-06-05: 4 mg via INTRAVENOUS
  Filled 2015-06-05: qty 2

## 2015-06-05 MED ORDER — CETYLPYRIDINIUM CHLORIDE 0.05 % MT LIQD
7.0000 mL | Freq: Two times a day (BID) | OROMUCOSAL | Status: DC
  Administered 2015-06-06 – 2015-06-07 (×4): 7 mL via OROMUCOSAL

## 2015-06-05 MED ORDER — SODIUM CHLORIDE 0.9 % IV SOLN
Freq: Once | INTRAVENOUS | Status: AC
  Administered 2015-06-05: 07:00:00 via INTRAVENOUS

## 2015-06-05 MED ORDER — IOHEXOL 300 MG/ML  SOLN
100.0000 mL | Freq: Once | INTRAMUSCULAR | Status: AC | PRN
  Administered 2015-06-05: 100 mL via INTRAVENOUS

## 2015-06-05 MED ORDER — SODIUM CHLORIDE 0.9 % IV SOLN
INTRAVENOUS | Status: DC
  Administered 2015-06-05 – 2015-06-06 (×3): via INTRAVENOUS

## 2015-06-05 MED ORDER — ONDANSETRON HCL 4 MG/2ML IJ SOLN: 4.0000 mg | Freq: Four times a day (QID) | INTRAMUSCULAR | Status: DC | PRN

## 2015-06-05 MED ORDER — TRAMADOL HCL 50 MG PO TABS
50.0000 mg | ORAL_TABLET | Freq: Once | ORAL | Status: AC
Start: 2015-06-05 — End: 2015-06-05
  Administered 2015-06-05: 50 mg via ORAL
  Filled 2015-06-05: qty 1

## 2015-06-05 MED ORDER — ONDANSETRON HCL 4 MG PO TABS
4.0000 mg | ORAL_TABLET | Freq: Four times a day (QID) | ORAL | Status: DC | PRN
  Administered 2015-06-06 (×2): 4 mg via ORAL
  Filled 2015-06-05 (×2): qty 1

## 2015-06-05 MED ORDER — METOCLOPRAMIDE HCL 5 MG/ML IJ SOLN
5.0000 mg | Freq: Once | INTRAMUSCULAR | Status: AC
  Administered 2015-06-05: 5 mg via INTRAVENOUS
  Filled 2015-06-05: qty 2

## 2015-06-05 MED ORDER — SODIUM CHLORIDE 0.9 % IV SOLN
Freq: Once | INTRAVENOUS | Status: AC
  Administered 2015-06-05: 14:00:00 via INTRAVENOUS

## 2015-06-05 MED ORDER — CHLORHEXIDINE GLUCONATE 0.12 % MT SOLN
15.0000 mL | Freq: Two times a day (BID) | OROMUCOSAL | Status: DC
  Administered 2015-06-06 – 2015-06-08 (×5): 15 mL via OROMUCOSAL
  Filled 2015-06-05 (×5): qty 15

## 2015-06-05 MED ORDER — FENTANYL CITRATE (PF) 100 MCG/2ML IJ SOLN
100.0000 ug | Freq: Once | INTRAMUSCULAR | Status: AC
  Administered 2015-06-05: 100 ug via INTRAVENOUS
  Filled 2015-06-05: qty 2

## 2015-06-05 MED ORDER — CIPROFLOXACIN IN D5W 400 MG/200ML IV SOLN
400.0000 mg | Freq: Two times a day (BID) | INTRAVENOUS | Status: DC
  Administered 2015-06-05 – 2015-06-06 (×2): 400 mg via INTRAVENOUS
  Filled 2015-06-05 (×2): qty 200

## 2015-06-05 MED ORDER — ONDANSETRON HCL 4 MG/2ML IJ SOLN: 4.0000 mg | Freq: Three times a day (TID) | INTRAMUSCULAR | Status: AC | PRN

## 2015-06-05 MED ORDER — MORPHINE SULFATE (PF) 2 MG/ML IV SOLN
1.0000 mg | INTRAVENOUS | Status: DC | PRN
  Administered 2015-06-06 – 2015-06-07 (×3): 1 mg via INTRAVENOUS
  Filled 2015-06-05 (×3): qty 1

## 2015-06-05 MED ORDER — FENTANYL CITRATE (PF) 100 MCG/2ML IJ SOLN
50.0000 ug | Freq: Once | INTRAMUSCULAR | Status: AC
  Administered 2015-06-05: 50 ug via INTRAVENOUS
  Filled 2015-06-05: qty 2

## 2015-06-05 NOTE — ED Notes (Signed)
Pt ambulated to the restroom in a stable manner.

## 2015-06-05 NOTE — ED Notes (Signed)
MD at bedside. 

## 2015-06-05 NOTE — ED Notes (Signed)
Dr. Thomasene Lot assisted to call [facility].

## 2015-06-05 NOTE — Progress Notes (Signed)
Pt admitted to unit from ED. Pt lives at home alone. Pt's skin is WNL. Pt oriented to unit and room. Will continue to monitor pt. Ranelle Oyster, RN

## 2015-06-05 NOTE — ED Provider Notes (Signed)
CSN: [id number]     Arrival date & time 06/05/15  0545 History   First MD Initiated Contact with Patient 06/05/15 0547     Chief Complaint  Patient presents with  . Rectal Bleeding     (Consider location/radiation/quality/duration/timing/severity/associated sxs/prior Treatment) HPI Comments: The patient is a 45 year old female, she presents with a complaint of lower GI bleeding. She reports that this came on on Wednesday evening about an hour and a half after eating wings which she felt were undercooked. She states that she became very diaphoretic, nauseated and had vomiting and diarrhea which initially was very watery however over the last 24 hours this has become bloody, large amounts of blood up to a quarter cup of blood at a time. She is feeling generally weak, her symptoms are persistent, she is now having increased abdominal cramping. She initially went to the emergency department last night but was unable to stay, the weights were long, she went home and called EMS this morning because of ongoing symptoms. She reports having a colonoscopy many years ago, she does not remember the indication or the outcome, states that she does not usually have hemorrhoids and is not on anticoagulants and has no known liver disease  Patient is a 45 y.o. female presenting with hematochezia. The history is provided by the patient.  Rectal Bleeding   Past Medical History  Diagnosis Date  . Depression   . PTSD (post-traumatic stress disorder)   . Night terrors   . Dissociative disorder   . Panic disorder (episodic paroxysmal anxiety)   . Bulging discs     neck/back  . Sciatica   . Tendonitis   . Knee problem     Left  . Endometriosis   . Migraine   . Anxiety   . Skin cancer    Past Surgical History  Procedure Laterality Date  . Tubal ligation  2002  . Radical hysterectomy  2011  . Hand surgery Left     Grafting  . Breast biopsy Left 2012   Family History  Problem Relation Age of Onset  .  Cancer Mother   . Leukemia Father   . Esophageal cancer    . Lung cancer    . Stroke    . Hypertension    . ALS    . Macular degeneration     Social History  Substance Use Topics  . Smoking status: Former Smoker -- 0.20 packs/day for 6 years    Types: Cigarettes    Quit date: 04/05/2011  . Smokeless tobacco: Never Used  . Alcohol Use: No   OB History    No data available     Review of Systems  Gastrointestinal: Positive for hematochezia.  All other systems reviewed and are negative.     Allergies  Benadryl  Home Medications   Prior to Admission medications   Medication Sig Start Date End Date Taking? Authorizing Provider  acetaminophen-codeine 120-12 MG/5ML solution Take 10 mLs by mouth every 4 (four) hours as needed for moderate pain. 02/08/15   Dalia Heading, PA-C  Aspirin-Acetaminophen-Caffeine (GOODY HEADACHE PO) Take 1 packet by mouth 3 (three) times daily as needed (headache).     Historical Provider, MD  cyclobenzaprine (FLEXERIL) 10 MG tablet Take 1 tablet (10 mg total) by mouth 2 (two) times daily as needed for muscle spasms. 05/05/15   Abigail Harris, PA-C  Guaifenesin 1200 MG TB12 Take 1 tablet (1,200 mg total) by mouth 2 (two) times daily. 02/08/15   Dalia Heading,  PA-C  naproxen (NAPROSYN) 375 MG tablet Take 1 tablet (375 mg total) by mouth 2 (two) times daily. 05/05/15   Margarita Mail, PA-C  traMADol (ULTRAM) 50 MG tablet Take 1 tablet (50 mg total) by mouth every 6 (six) hours as needed. 05/05/15   Abigail Harris, PA-C   BP 92/59 mmHg  Pulse 82  Temp(Src) 98.5 F (36.9 C) (Oral)  Resp 18  SpO2 100% Physical Exam  Constitutional: She appears well-developed and well-nourished. No distress.  HENT:  Head: Normocephalic and atraumatic.  Mouth/Throat: Oropharynx is clear and moist. No oropharyngeal exudate.  Eyes: Conjunctivae and EOM are normal. Pupils are equal, round, and reactive to light. Right eye exhibits no discharge. Left eye exhibits no  discharge. No scleral icterus.  Neck: Normal range of motion. Neck supple. No JVD present. No thyromegaly present.  Cardiovascular: Normal rate, regular rhythm, normal heart sounds and intact distal pulses.  Exam reveals no gallop and no friction rub.   No murmur heard. Pulmonary/Chest: Effort normal and breath sounds normal. No respiratory distress. She has no wheezes. She has no rales.  Abdominal: Soft. Bowel sounds are normal. She exhibits no distension and no mass. There is tenderness ( Tenderness to palpation suprapubic and left lower quadrant, no other abdominal tenderness).  Genitourinary:  Chaperone present for rectal exam, large external hemorrhoids, nonbleeding, soft, no thrombosis, internal exam with a good amount of bright red mixed with dark red blood, no internal hemorrhoids, no fissures  Musculoskeletal: Normal range of motion. She exhibits no edema or tenderness.  Lymphadenopathy:    She has no cervical adenopathy.  Neurological: She is alert. Coordination normal.  Skin: Skin is warm and dry. No rash noted. No erythema.  Psychiatric: She has a normal mood and affect. Her behavior is normal.  Nursing note and vitals reviewed.   ED Course  Procedures (including critical care time) Labs Review Labs Reviewed  CBC WITH DIFFERENTIAL/PLATELET - Abnormal; Notable for the following:    WBC 12.1 (*)    Neutro Abs 8.6 (*)    Monocytes Absolute 1.1 (*)    All other components within normal limits  COMPREHENSIVE METABOLIC PANEL - Abnormal; Notable for the following:    Glucose, Bld 114 (*)    Calcium 8.8 (*)    Total Protein 6.4 (*)    ALT 12 (*)    Total Bilirubin 1.3 (*)    All other components within normal limits  ETHANOL  URINALYSIS, ROUTINE W REFLEX MICROSCOPIC (NOT AT [facility])  PREGNANCY, URINE    Imaging Review No results found. I have personally reviewed and evaluated these images and lab results as part of my medical decision-making.    MDM   Final diagnoses:   Lower GI bleed    The patient is having a GI bleed, she is not tachycardic or hypotensive however she does appear very uncomfortable and has a good amount of tenderness. I am concerned for a lower GI bleed, this does not appear to be a hemorrhoidal bleed, I anticipate that the patient will be admitted to the hospital for GI bleed, type and screen, fluids, labs.  Hgb is normal - the pt has had several episodes of cramping followed by evacuation of blood from the rectum - she has no pain after fentanyl - the VS have been relatively normal, one isolated measurement that was low - improved with fluids - abd still tender on the LLQ.  Will admit for GI bleed with abdominal pain / concern for lower GI  bleed.  D/w Dr. Marily Memos who has accepted for admission - Med surg bed - inpatient - holding orders placed  Noemi Chapel, MD 06/05/15 [phone]

## 2015-06-05 NOTE — H&P (Signed)
[facility] History and Physical                                                                                    [name], is a 45 y.o. female  MRN: [id number]   DOB - 12-May-1970  Admit Date - 06/05/2015  Outpatient Primary MD for the patient is [place]  Referring MD: Johnnye Lana  Consulting M.D: [facility]  PMH: Past Medical History  Diagnosis Date  . Depression   . PTSD (post-traumatic stress disorder)   . Night terrors   . Dissociative disorder   . Panic disorder (episodic paroxysmal anxiety)   . Bulging discs     neck/back  . Sciatica   . Tendonitis   . Knee problem     Left  . Endometriosis   . Migraine   . Anxiety   . Skin cancer       PSH: Past Surgical History  Procedure Laterality Date  . Tubal ligation  2002  . Radical hysterectomy  2011  . Hand surgery Left     Grafting  . Breast biopsy Left 2012     CC:  Chief Complaint  Patient presents with  . Rectal Bleeding     HPI: 45 year old female patient with history of PTSD and depression as well as panic disorder, recurrent migraines and sciatica. Patient reports that on Wednesday evening she went out to eat and consumed wings and some alcoholic beverages. About 1-1/2 hours later she began having recurrent vomiting. Eventually she was able to go to sleep but was awakened around 4 AM initially with explosive watery diarrhea and abdominal cramping which later developed into bloody diarrhea. He was also associated with suprapubic pain. Symptoms persisted and the pain became more intense and she describes it as being very sharp like glass passing through her intestines. At this point the diarrhea had changed to bloody. At this point she suspected food poisoning since she has previously eaten undercooked chicken at this particular restaurant in the past. By Thursday she eventually sought treatment at the [facility] but became discouraged at the long wait and eventually went back  home. Her symptoms seemed to subside briefly but once again early this morning around 4 AM she awakened with severe pain again but more colicky in nature and had localized more to the left abdomen she began having blood once again. She also describes fevers of 102.3 with chills and myalgias and rigors. She has not had any influenza symptoms such as headache rhinorrhea or cough. She called EMS and was transported to [facility] and eventually transferred to [facility] ER for direct admission. She was not admitted directly to a bed due to lack of availability and instead sent to the ER because of presenting symptomatology of lower GI bleeding with hemoglobin rapidly trending downward. Unfortunately at [facility] urgent GI evaluation as well as ministration of blood could not be accomplished.  ER Evaluation and treatment: Afebrile, blood pressure has ranged from a high of 102/56 to a low of 88/60, pulses remain steady between 70s and 80s, respirations steady between 16 and 18 and room air saturations 99-100%. Laboratory data: Na 138,  K 3.6, BUN 14, Cr 0.75, glucose 114, ALT 12, total bilirubin 1.3; initial lab work obtained at first ER visit on 3/2 WBCs were 15,800 with a hemoglobin of 14.1-CBC this morning at 620am WBCs 12,100 with hemoglobin 13.1 neutrophils 71% and absolute neutrophils 8.6%-repeat hemoglobin at 1 PM today 10.9, urinalysis unremarkable, alcohol level less than 5  Review of Systems   In addition to the HPI above,  No Headache, changes with Vision or hearing, new weakness, tingling, numbness in any extremity, No problems swallowing food or Liquids, indigestion/reflux No Chest pain, Cough or Shortness of Breath, palpitations, orthopnea or DOE No dysuria, hematuria or flank pain No new skin rashes, lesions, masses or bruises, No new joints pains-aches No recent weight gain or loss No polyuria, polydypsia or polyphagia,  *A full 10 point Review of Systems was done, except as stated above, all other Review  of Systems were negative.  Social History Social History  Substance Use Topics  . Smoking status: Former Smoker -- 0.20 packs/day for 6 years    Types: Cigarettes    Quit date: 04/05/2011  . Smokeless tobacco: Never Used  . Alcohol Use: No    Resides at: Private residence  Lives with: Alone  Ambulatory status: Without assistive devices   Family History Family History  Problem Relation Age of Onset  . Cancer Mother   . Leukemia Father   . Esophageal cancer    . Lung cancer    . Stroke    . Hypertension    . ALS    . Macular degeneration       Prior to Admission medications   Medication Sig Start Date End Date Taking? Authorizing Provider  acetaminophen (TYLENOL) 500 MG tablet Take 500 mg by mouth every 6 (six) hours as needed for mild pain.   Yes Historical Provider, MD  Aspirin-Acetaminophen-Caffeine (GOODY HEADACHE PO) Take 1 packet by mouth 3 (three) times daily as needed (headache).    Yes Historical Provider, MD  acetaminophen-codeine 120-12 MG/5ML solution Take 10 mLs by mouth every 4 (four) hours as needed for moderate pain. Patient not taking: Reported on 06/05/2015 02/08/15   Dalia Heading, PA-C  cyclobenzaprine (FLEXERIL) 10 MG tablet Take 1 tablet (10 mg total) by mouth 2 (two) times daily as needed for muscle spasms. Patient not taking: Reported on 06/05/2015 05/05/15   Margarita Mail, PA-C  Guaifenesin 1200 MG TB12 Take 1 tablet (1,200 mg total) by mouth 2 (two) times daily. Patient not taking: Reported on 06/05/2015 02/08/15   Dalia Heading, PA-C  naproxen (NAPROSYN) 375 MG tablet Take 1 tablet (375 mg total) by mouth 2 (two) times daily. Patient not taking: Reported on 06/05/2015 05/05/15   Margarita Mail, PA-C  traMADol (ULTRAM) 50 MG tablet Take 1 tablet (50 mg total) by mouth every 6 (six) hours as needed. Patient not taking: Reported on 06/05/2015 05/05/15   Margarita Mail, PA-C    Allergies  Allergen Reactions  . Benadryl [Diphenhydramine Hcl] Anxiety      Physical Exam  Vitals  Blood pressure 102/56, pulse 71, temperature 98.6 F (37 C), temperature source Oral, resp. rate 12, SpO2 99 %.   General:  In no acute distress, appears healthy and well nourished  Psych:  Normal affect, Denies Suicidal or Homicidal ideations, Awake Alert, Oriented X 3. Speech and thought patterns are clear and appropriate, no apparent short term memory deficits  Neuro:   No focal neurological deficits, CN II through XII intact, Strength 5/5 all 4 extremities, Sensation  intact all 4 extremities.  ENT:  Ears and Eyes appear Normal, Conjunctivae clear and appropriately pink/reddened ie no pallor, PER. Moist oral mucosa without erythema or exudates.  Neck:  Supple, No lymphadenopathy appreciated  Respiratory:  Symmetrical chest wall movement, Good air movement bilaterally, CTAB. Room Air  Cardiac:  RRR, No Murmurs, no LE edema noted, no JVD, No carotid bruits, peripheral pulses palpable at 2+  Abdomen: Hypoactive bowel sounds, Soft, focally tender left lower abdomen just lateral to the midline with guarding and minimal rebounding, Non distended,  No masses appreciated, no obvious hepatosplenomegaly  Skin:  No Cyanosis, Normal Skin Turgor, No Skin Rash or Bruise.  Extremities: Symmetrical without obvious trauma or injury,  no effusions.  Data Review  CBC  Recent Labs Lab 06/04/15 1855 06/05/15 0620 06/05/15 1300  WBC 15.8* 12.1*  --   HGB 14.1 13.1 10.9*  HCT 41.4 37.2 31.7*  PLT 273 232  --   MCV 88.8 88.6  --   MCH 30.3 31.2  --   MCHC 34.1 35.2  --   RDW 12.8 12.3  --   LYMPHSABS  --  2.1  --   MONOABS  --  1.1*  --   EOSABS  --  0.3  --   BASOSABS  --  0.0  --     Chemistries   Recent Labs Lab 06/04/15 1855 06/05/15 0620  NA 135 138  K 3.8 3.6  CL 102 102  CO2 22 27  GLUCOSE 121* 114*  BUN 17 14  CREATININE 0.68 0.75  CALCIUM 9.6 8.8*  AST 18 17  ALT 14 12*  ALKPHOS 61 58  BILITOT 1.3* 1.3*    CrCl cannot be  calculated (Unknown ideal weight.).  No results for input(s): TSH, T4TOTAL, T3FREE, THYROIDAB in the last 72 hours.  Invalid input(s): FREET3  Coagulation profile No results for input(s): INR, PROTIME in the last 168 hours.  No results for input(s): DDIMER in the last 72 hours.  Cardiac Enzymes No results for input(s): CKMB, TROPONINI, MYOGLOBIN in the last 168 hours.  Invalid input(s): CK  Invalid input(s): POCBNP  Urinalysis    Component Value Date/Time   COLORURINE YELLOW 06/05/2015 0605   APPEARANCEUR CLEAR 06/05/2015 0605   LABSPEC 1.018 06/05/2015 0605   PHURINE 6.0 06/05/2015 0605   GLUCOSEU NEGATIVE 06/05/2015 0605   HGBUR NEGATIVE 06/05/2015 0605   BILIRUBINUR NEGATIVE 06/05/2015 0605   KETONESUR NEGATIVE 06/05/2015 0605   PROTEINUR NEGATIVE 06/05/2015 0605   NITRITE NEGATIVE 06/05/2015 0605   LEUKOCYTESUR NEGATIVE 06/05/2015 0605    Imaging results:   No results found.   Assessment & Plan  Principal Problem:   Lower GI bleed/Watery diarrhea -History concerning for food poisoning although diverticulitis in the differential as well giving location of pain; less likely NSAIDs induced ulcer -Currently no further bleeding and is hemodynamically stable so we'll admit to floor/Inpatient -CT abdomen and pelvis with contrast -Begin empiric Cipro IV 400 mg every 12 hours -[facility] consulted for possible endoscopy -Symptom management with IV morphine -NPO until evaluated by GI -Blood cultures x 2 -GI pathogen panel -IV fluids at 125 per hour  Active Problems:   Acute blood loss anemia -Hemoglobin has dropped nearly 3 g since 3/2 -Stat CBC upon arrival to ER -Anemia panel and TSH -Otherwise healthy female so would only transfuse for hemoglobin less than or equal to 8.0 or if becomes symptomatic with chest pain, hypotension or tachycardia    Leukocytosis/Fever -Likely multifactorial but given  history suggestive of infectious etiology to diarrhea    DVT  Prophylaxis: SCDs  Family Communication: No family of bedside     Code Status:  Full code  Condition:  Stable  Discharge disposition: Anticipate discharge back to preadmission environment  Time spent in minutes : 60      Godbey,ALLISON L. ANP on 06/05/2015 at 5:02 PM  You may contact me by going to [email or web address] - password [email or web address]  I am available from 7a-7p but please confirm I am on the schedule by going to Amion as above.   After 7p please contact night coverage person covering me after hours  [facility]

## 2015-06-05 NOTE — ED Provider Notes (Signed)
1:22 PM I just rounded on patients who have been sitting here in our emergency department waiting for transfer. I repeated the patient's H&H. It was noted to be dropped again. This is total 5. hemoglobin drop since yesterday evening.  There are no beds available the hospital which is why she's been waiting so long. Patient needs to see GI and be scoped. Patient's last bowel movement was nonbloody. However patient's blood pressures are becoming a little bit soft and with this new knowledge of her continuing to drop hemoglobin, it would be in her best interest to move patient over to a hospital that can scope her  We only have 1 unit of blood here.  Placing call to GI to see if we can facilitate the patient more quickly to appropriate space.  GI would like stabilization before scope since its lower GI.    1:50 PM Discussed with [facility].  We will transfer to ED since we are still 1 hour from having a ready bed.  Discussed with [place], charge and Dr. Venora Maples.   Tara Justiss Julio Alm, MD 06/05/15 1351

## 2015-06-05 NOTE — ED Notes (Signed)
Pt brought to ED via [facility]. Describes "food poisoning" onset Wed. Rectal bleeding onset 0500. Now c/o low abd pain. Went to [employer], but left. Also c/o back pain.

## 2015-06-05 NOTE — Consult Note (Signed)
Referring Provider: Dr. Marily Memos Primary Care Physician:  [place] Primary Gastroenterologist:  Althia Forts  Reason for Consultation:  GI bleed; Diarrhea  HPI: [name] is a 45 y.o. female who developed the acute onset of N/V/D after eating chicken wings that she thinks were undercooked. Started having severe abdominal cramping as well along with profuse vomiting of food. She then developed explosive watery diarrhea which became bloody and had multiple episodes of bloody diarrhea since early Thursday morning. She went to [facility] ER and could not sit in a chair due to the extreme cramping and went home because she got tired of waiting for a bed to lie down on. She did have a fever of 102.3 with severe chills yesterday. Denies any hematemesis or melena. Abdominal pain was across her lower abdomen and now is in her LLQ only. Denies any bleeding since 10 AM this morning. Denies any previous episodes of bloody diarrhea. Has not had a colonoscopy. She did drink alcohol with eating the chicken wings. Hgb 11.7 (14.1 earlier this morning). BP 80's -100's. HR 70's-80's. CT showed left-sided colonic wall thickening (descending colon to proximal sigmoid colon). Takes Naprosyn and Goody Powders prn.    Past Medical History  Diagnosis Date  . Depression   . PTSD (post-traumatic stress disorder)   . Night terrors   . Dissociative disorder   . Panic disorder (episodic paroxysmal anxiety)   . Bulging discs     neck/back  . Sciatica   . Tendonitis   . Knee problem     Left  . Endometriosis   . Migraine   . Anxiety   . Skin cancer     Past Surgical History  Procedure Laterality Date  . Tubal ligation  2002  . Radical hysterectomy  2011  . Hand surgery Left     Grafting  . Breast biopsy Left 2012    Prior to Admission medications   Medication Sig Start Date End Date Taking? Authorizing Provider  acetaminophen (TYLENOL) 500 MG tablet Take 500 mg by mouth every 6 (six) hours as needed  for mild pain.   Yes Historical Provider, MD  Aspirin-Acetaminophen-Caffeine (GOODY HEADACHE PO) Take 1 packet by mouth 3 (three) times daily as needed (headache).    Yes Historical Provider, MD  acetaminophen-codeine 120-12 MG/5ML solution Take 10 mLs by mouth every 4 (four) hours as needed for moderate pain. Patient not taking: Reported on 06/05/2015 02/08/15   Dalia Heading, PA-C  cyclobenzaprine (FLEXERIL) 10 MG tablet Take 1 tablet (10 mg total) by mouth 2 (two) times daily as needed for muscle spasms. Patient not taking: Reported on 06/05/2015 05/05/15   Margarita Mail, PA-C  Guaifenesin 1200 MG TB12 Take 1 tablet (1,200 mg total) by mouth 2 (two) times daily. Patient not taking: Reported on 06/05/2015 02/08/15   Dalia Heading, PA-C  naproxen (NAPROSYN) 375 MG tablet Take 1 tablet (375 mg total) by mouth 2 (two) times daily. Patient not taking: Reported on 06/05/2015 05/05/15   Margarita Mail, PA-C  traMADol (ULTRAM) 50 MG tablet Take 1 tablet (50 mg total) by mouth every 6 (six) hours as needed. Patient not taking: Reported on 06/05/2015 05/05/15   Margarita Mail, PA-C    Scheduled Meds:  Continuous Infusions: . sodium chloride 125 mL/hr at 06/05/15 1638  . ciprofloxacin 400 mg (06/05/15 1800)   PRN Meds:.morphine injection, ondansetron (ZOFRAN) IV, ondansetron **OR** ondansetron (ZOFRAN) IV  Allergies as of 06/05/2015 - Review Complete 06/05/2015  Allergen Reaction Noted  . Benadryl [diphenhydramine  hcl] Anxiety 05/11/2011    Family History  Problem Relation Age of Onset  . Cancer Mother   . Leukemia Father   . Esophageal cancer    . Lung cancer    . Stroke    . Hypertension    . ALS    . Macular degeneration      Social History   Social History  . Marital Status: Married    Spouse Name: N/A  . Number of Children: 3  . Years of Education: College   Occupational History  . Not on file.   Social History Main Topics  . Smoking status: Former Smoker -- 0.20 packs/day  for 6 years    Types: Cigarettes    Quit date: 04/05/2011  . Smokeless tobacco: Never Used  . Alcohol Use: No  . Drug Use: No  . Sexual Activity: Not on file   Other Topics Concern  . Not on file   Social History Narrative   Caffeine Use: none    Review of Systems: All negative except as stated above in HPI.  Physical Exam: Vital signs: Filed Vitals:   06/05/15 1700 06/05/15 1715  BP: 96/60 95/60  Pulse: 83 73  Temp:  98.5  Resp: 16 13     General:   Alert, Well-developed, well-nourished, pleasant and cooperative in NAD Head: atraumatic Eyes: anicteric sclera ENT: oropharynx clear Neck: supple, nontender Lungs:  Clear throughout to auscultation.   No wheezes, crackles, or rhonchi. No acute distress. Heart:  Regular rate and rhythm; no murmurs, clicks, rubs,  or gallops. Abdomen: LLQ tenderness with guarding, soft, nondistended, +BS  Rectal:  Deferred Ext: no edema Skin: no rash  GI:  Lab Results:  Recent Labs  06/04/15 1855 06/05/15 0620 06/05/15 1300 06/05/15 1743  WBC 15.8* 12.1*  --  9.8  HGB 14.1 13.1 10.9* 11.7*  HCT 41.4 37.2 31.7* 33.4*  PLT 273 232  --  194   BMET  Recent Labs  06/04/15 1855 06/05/15 0620  NA 135 138  K 3.8 3.6  CL 102 102  CO2 22 27  GLUCOSE 121* 114*  BUN 17 14  CREATININE 0.68 0.75  CALCIUM 9.6 8.8*   LFT  Recent Labs  06/05/15 0620  PROT 6.4*  ALBUMIN 3.9  AST 17  ALT 12*  ALKPHOS 58  BILITOT 1.3*   PT/INR No results for input(s): LABPROT, INR in the last 72 hours.   Studies/Results: Ct Abdomen Pelvis W Contrast  06/05/2015  CLINICAL DATA:  Followup for hematochezia. EXAM: CT ABDOMEN AND PELVIS WITH CONTRAST TECHNIQUE: Multidetector CT imaging of the abdomen and pelvis was performed using the standard protocol following bolus administration of intravenous contrast. CONTRAST:  142mL OMNIPAQUE IOHEXOL 300 MG/ML  SOLN COMPARISON:  None. FINDINGS: Lung bases:  Clear.  Heart normal in size. Liver:  16 mm cyst  in the caudate lobe.  Otherwise unremarkable. Spleen, gallbladder, pancreas, adrenal glands:  Unremarkable. Kidneys, ureters, bladder:  Unremarkable. Lymph nodes:  No pathologically enlarged lymph nodes. Ascites:  Trace free fluid in the posterior pelvic recess. Gastrointestinal: There is wall thickening of the descending colon extending to the proximal sigmoid. There is mild associated mesenteric inflammatory stranding and a trace amount of fluid along the left posterior pericolic gutter. Remainder of the colon is unremarkable. Stomach and small bowel are unremarkable. Normal appendix. Musculoskeletal: Disc degenerative changes at L5-S1. No osteoblastic or osteolytic lesions. IMPRESSION: 1. Colon wall thickening with associated inflammation along the descending and proximal sigmoid colon consistent  with an infectious or inflammatory colitis. 2. No other acute findings.  Template Electronically Signed   By: Lajean Manes M.D.   On: 06/05/2015 18:09    Impression/Plan: 45 yo woman with the acute onset of nonbloody vomitus, bloody diarrhea, fevers/chills, and abdominal pain that I think is due to gastroenteritis (viral vs. Bacterial) from food poisoning. CT findings likely due to infectious colitis. I would NOT recommend a colonoscopy or EGD at this time. Would continue aggressive IVFs. Ice chips tonight ok and change to clears tomorrow morning if doing ok and advance slowly. Supportive care. If bleeding recurs and persists, then will consider a sigmoidoscopy otherwise I do not think an endoscopic procedure is needed. Continue Abx. Agree with GI pathogen panel. Consider d/c in next 1-2 days if no further bleeding and tolerates advancing of diet.    LOS: 0 days   Wann C.  06/05/2015, 8:01 PM  Pager [phone]  If no answer or after 5 PM call [phone]

## 2015-06-05 NOTE — Progress Notes (Signed)
Notified Callahan,NP that pt states her headache is coming back. NP ordered Ultram 50mg  pox1. Will continue to monitor pt.Ranelle Oyster, RN

## 2015-06-05 NOTE — Progress Notes (Signed)
Notified Rogue Bussing, NP that pt having severe headache requesting something other than Morphine. NP ordered Ultram x1. Will continue to monitor pt. Hassan Buckler

## 2015-06-05 NOTE — ED Notes (Signed)
Paged Admitting Dr to Mali Grose, RN.

## 2015-06-05 NOTE — Progress Notes (Signed)
Pt coming to [facility] from [facility] for GI bleed adn intractable abd pain. Hemodynamically stable. Hgb down 3g in past 24hrs. GI consult on arrival.   Linna Darner, MD [place] 06/05/2015, 7:35 AM

## 2015-06-05 NOTE — ED Provider Notes (Addendum)
Screening exam. Accepted here from [place]. Complains of left lower quadrant pain and some nausea. She appears in no distress. She is alert and talkative. Intravenous fentanyl, Reglan and normal saline ordered. Pending evaluation by hospitalist physician  Orlie Dakin, MD 06/05/15 1628  5:05 PM patient feels much improved after treatment with intravenous fentanyl, Reglan and intravenous fluids. She is resting comfortably in bed, and has been evaluated by the admitting service.  Orlie Dakin, MD 06/05/15 [place], MD 06/05/15 1710

## 2015-06-06 DIAGNOSIS — R519 Headache, unspecified: Secondary | ICD-10-CM | POA: Diagnosis present

## 2015-06-06 DIAGNOSIS — R51 Headache: Secondary | ICD-10-CM

## 2015-06-06 DIAGNOSIS — A09 Infectious gastroenteritis and colitis, unspecified: Principal | ICD-10-CM

## 2015-06-06 LAB — T4, FREE: FREE T4: 1.3 ng/dL — AB (ref 0.61–1.12)

## 2015-06-06 MED ORDER — ACETAMINOPHEN 325 MG PO TABS
650.0000 mg | ORAL_TABLET | Freq: Four times a day (QID) | ORAL | Status: DC | PRN
  Administered 2015-06-06 – 2015-06-07 (×3): 650 mg via ORAL
  Filled 2015-06-06 (×3): qty 2

## 2015-06-06 MED ORDER — DEXTROSE 5 % IV SOLN
500.0000 mg | INTRAVENOUS | Status: DC
  Administered 2015-06-06 – 2015-06-07 (×2): 500 mg via INTRAVENOUS
  Filled 2015-06-06 (×2): qty 500

## 2015-06-06 MED ORDER — POTASSIUM CHLORIDE IN NACL 20-0.9 MEQ/L-% IV SOLN
INTRAVENOUS | Status: DC
  Administered 2015-06-06 – 2015-06-08 (×3): via INTRAVENOUS
  Filled 2015-06-06 (×4): qty 1000

## 2015-06-06 MED ORDER — ONDANSETRON HCL 4 MG/2ML IJ SOLN
4.0000 mg | Freq: Four times a day (QID) | INTRAMUSCULAR | Status: DC | PRN
  Administered 2015-06-06 – 2015-06-07 (×3): 4 mg via INTRAVENOUS
  Filled 2015-06-06 (×3): qty 2

## 2015-06-06 MED ORDER — TRAMADOL HCL 50 MG PO TABS
50.0000 mg | ORAL_TABLET | Freq: Once | ORAL | Status: DC
  Filled 2015-06-06: qty 1

## 2015-06-06 NOTE — Progress Notes (Signed)
[facility] PROGRESS NOTE  Takecia Degraw [id number] DOB: [birth year] DOA: 06/05/2015 [facility]  Assessment/Plan:  Principal Problem:   Bloody diarrhea/infectious colitis: still nauseated, but no stool since yesterday. Not taking much PO and wants to stick with clears.  Active Problems:   Acute blood loss anemia   Headache: patient thinks it's from Cipro. Will change to azithromycin. Prn tylenol  HPI/Subjective: Main complaint is headache. Holocephalic. See above. Denies photophobia  Objective: Filed Vitals:   06/06/15 0528 06/06/15 0619  BP: 81/58 96/44  Pulse: 87 50  Temp: 98.7 F (37.1 C)   Resp: 18     Intake/Output Summary (Last 24 hours) at 06/06/15 1135 Last data filed at 06/06/15 1125  Gross per 24 hour  Intake 1636.67 ml  Output      0 ml  Net 1636.67 ml   Filed Weights   06/05/15 2018  Weight: 74.2 kg (163 lb 9.3 oz)    Exam:   General:  Asleep. Arousable. Uncomfortable. Room bright  HEENT: no sinus tenderness  Cardiovascular: RRR without MGR  Respiratory: CTA without WRR  Abdomen: Soft. Minimal LLQ tenderness  Ext: no CCE  Basic Metabolic Panel:  Recent Labs Lab 06/04/15 1855 06/05/15 0620 06/05/15 1903 06/05/15 2248  NA 135 138  --  137  K 3.8 3.6  --  3.5  CL 102 102  --  106  CO2 22 27  --  21*  GLUCOSE 121* 114*  --  94  BUN 17 14  --  9  CREATININE 0.68 0.75  --  0.70  CALCIUM 9.6 8.8*  --  7.7*  MG  --   --  1.5*  --   PHOS  --   --  2.7  --    Liver Function Tests:  Recent Labs Lab 06/04/15 1855 06/05/15 0620 06/05/15 2248  AST 18 17 14*  ALT 14 12* 10*  ALKPHOS 61 58 44  BILITOT 1.3* 1.3* 0.9  PROT 7.0 6.4* 5.2*  ALBUMIN 4.3 3.9 2.9*    Recent Labs Lab 06/04/15 1855  LIPASE 20   No results for input(s): AMMONIA in the last 168 hours. CBC:  Recent Labs Lab 06/04/15 1855 06/05/15 0620 06/05/15 1300 06/05/15 1743 06/05/15 2248  WBC 15.8* 12.1*  --  9.8 8.3  NEUTROABS  --   8.6*  --   --   --   HGB 14.1 13.1 10.9* 11.7* 10.6*  HCT 41.4 37.2 31.7* 33.4* 31.6*  MCV 88.8 88.6  --  89.1 88.8  PLT 273 232  --  194 183   Cardiac Enzymes: No results for input(s): CKTOTAL, CKMB, CKMBINDEX, TROPONINI in the last 168 hours. BNP (last 3 results) No results for input(s): BNP in the last 8760 hours.  ProBNP (last 3 results) No results for input(s): PROBNP in the last 8760 hours.  CBG: No results for input(s): GLUCAP in the last 168 hours.  No results found for this or any previous visit (from the past 240 hour(s)).   Studies: Ct Abdomen Pelvis W Contrast  06/05/2015  CLINICAL DATA:  Followup for hematochezia. EXAM: CT ABDOMEN AND PELVIS WITH CONTRAST TECHNIQUE: Multidetector CT imaging of the abdomen and pelvis was performed using the standard protocol following bolus administration of intravenous contrast. CONTRAST:  121mL OMNIPAQUE IOHEXOL 300 MG/ML  SOLN COMPARISON:  None. FINDINGS: Lung bases:  Clear.  Heart normal in size. Liver:  16 mm cyst in the caudate lobe.  Otherwise unremarkable. Spleen, gallbladder, pancreas, adrenal glands:  Unremarkable. Kidneys, ureters, bladder:  Unremarkable. Lymph nodes:  No pathologically enlarged lymph nodes. Ascites:  Trace free fluid in the posterior pelvic recess. Gastrointestinal: There is wall thickening of the descending colon extending to the proximal sigmoid. There is mild associated mesenteric inflammatory stranding and a trace amount of fluid along the left posterior pericolic gutter. Remainder of the colon is unremarkable. Stomach and small bowel are unremarkable. Normal appendix. Musculoskeletal: Disc degenerative changes at L5-S1. No osteoblastic or osteolytic lesions. IMPRESSION: 1. Colon wall thickening with associated inflammation along the descending and proximal sigmoid colon consistent with an infectious or inflammatory colitis. 2. No other acute findings.  Template Electronically Signed   By: Lajean Manes M.D.   On:  06/05/2015 18:09    Scheduled Meds: . antiseptic oral rinse  7 mL Mouth Rinse q12n4p  . azithromycin  500 mg Intravenous Q24H  . chlorhexidine  15 mL Mouth Rinse BID  . traMADol  50 mg Oral Once   Continuous Infusions: . sodium chloride 125 mL/hr at 06/06/15 0855    Time spent: 25 minutes  [facility] [email or web address], password [facility] 06/06/2015, 11:35 AM  LOS: 1 day

## 2015-06-06 NOTE — Progress Notes (Signed)
Patient ID: [name], female   DOB: 06-27-1970, 45 y.o.   MRN: [id number] [facility] Progress Note  [name] 45 y.o. 1970/07/11   Subjective: Denies any further bleeding or diarrhea since admit. Lower abdominal cramping. Having headaches. Feels weak. Hungry.  Objective: Vital signs in last 24 hours: Filed Vitals:   06/06/15 0619 06/06/15 1611  BP: 96/44 106/67  Pulse: 50 70  Temp:  98.4 F (36.9 C)  Resp:  16    Physical Exam: Gen: lethargic, no acute distress HEENT: anicteric sclera CV: RRR Chest: CTA B Abd: LLQ tenderness with minimal guarding, soft, nondistended, +BS Ext: no edema  Lab Results:  Recent Labs  06/05/15 0620 06/05/15 1903 06/05/15 2248  NA 138  --  137  K 3.6  --  3.5  CL 102  --  106  CO2 27  --  21*  GLUCOSE 114*  --  94  BUN 14  --  9  CREATININE 0.75  --  0.70  CALCIUM 8.8*  --  7.7*  MG  --  1.5*  --   PHOS  --  2.7  --     Recent Labs  06/05/15 0620 06/05/15 2248  AST 17 14*  ALT 12* 10*  ALKPHOS 58 44  BILITOT 1.3* 0.9  PROT 6.4* 5.2*  ALBUMIN 3.9 2.9*    Recent Labs  06/05/15 0620  06/05/15 1743 06/05/15 2248  WBC 12.1*  --  9.8 8.3  NEUTROABS 8.6*  --   --   --   HGB 13.1  < > 11.7* 10.6*  HCT 37.2  < > 33.4* 31.6*  MCV 88.6  --  89.1 88.8  PLT 232  --  194 183  < > = values in this interval not displayed. No results for input(s): LABPROT, INR in the last 72 hours.    Assessment/Plan: Resolving gastroenteritis - continue IVFs. Advance diet as tolerated. Supportive care. No BMs since admit to collect stool sample. If tolerates diet, then ok to go home in next 1-2 days. Will sign off. Call if questions.   Tara Ridge C. 06/06/2015, 4:17 PM  Pager [phone]  If no answer or after 5 PM call [phone]

## 2015-06-07 LAB — T3, FREE: T3, Free: 2.8 pg/mL (ref 2.0–4.4)

## 2015-06-07 MED ORDER — KETOROLAC TROMETHAMINE 30 MG/ML IJ SOLN
30.0000 mg | Freq: Four times a day (QID) | INTRAMUSCULAR | Status: DC | PRN
  Administered 2015-06-07 (×2): 30 mg via INTRAVENOUS
  Filled 2015-06-07 (×2): qty 1

## 2015-06-07 MED ORDER — MAGNESIUM SULFATE 2 GM/50ML IV SOLN
2.0000 g | Freq: Once | INTRAVENOUS | Status: AC
  Administered 2015-06-07: 2 g via INTRAVENOUS
  Filled 2015-06-07: qty 50

## 2015-06-07 NOTE — Progress Notes (Signed)
[facility] PROGRESS NOTE  [name] [id number] DOB: 01/29/1971 DOA: 06/05/2015 PCP: [place]  Assessment/Plan:  Principal Problem:   Bloody diarrhea/infectious colitis: vomited this am. No stool. Monitor another 24-48 hours Active Problems:   Acute blood loss anemia   Headache: remains, but improved  HPI/Subjective: Tolerated solid dinner last night, but vomited this am. No stool  Objective: Filed Vitals:   06/07/15 1401 06/07/15 2149  BP: 108/60 100/59  Pulse: 78 78  Temp: 97.4 F (36.3 C) 98.2 F (36.8 C)  Resp: 16 16    Intake/Output Summary (Last 24 hours) at 06/07/15 2151 Last data filed at 06/07/15 1529  Gross per 24 hour  Intake    340 ml  Output    800 ml  Net   -460 ml   Filed Weights   06/05/15 2018  Weight: 74.2 kg (163 lb 9.3 oz)    Exam:   General:  Brighter, appears more comfortable  HEENT: no sinus tenderness  Cardiovascular: RRR without MGR  Respiratory: CTA without WRR  Abdomen: Soft. Minimal LLQ tenderness  Ext: no CCE  Basic Metabolic Panel:  Recent Labs Lab 06/04/15 1855 06/05/15 0620 06/05/15 1903 06/05/15 2248  NA 135 138  --  137  K 3.8 3.6  --  3.5  CL 102 102  --  106  CO2 22 27  --  21*  GLUCOSE 121* 114*  --  94  BUN 17 14  --  9  CREATININE 0.68 0.75  --  0.70  CALCIUM 9.6 8.8*  --  7.7*  MG  --   --  1.5*  --   PHOS  --   --  2.7  --    Liver Function Tests:  Recent Labs Lab 06/04/15 1855 06/05/15 0620 06/05/15 2248  AST 18 17 14*  ALT 14 12* 10*  ALKPHOS 61 58 44  BILITOT 1.3* 1.3* 0.9  PROT 7.0 6.4* 5.2*  ALBUMIN 4.3 3.9 2.9*    Recent Labs Lab 06/04/15 1855  LIPASE 20   No results for input(s): AMMONIA in the last 168 hours. CBC:  Recent Labs Lab 06/04/15 1855 06/05/15 0620 06/05/15 1300 06/05/15 1743 06/05/15 2248  WBC 15.8* 12.1*  --  9.8 8.3  NEUTROABS  --  8.6*  --   --   --   HGB 14.1 13.1 10.9* 11.7* 10.6*  HCT 41.4 37.2 31.7* 33.4* 31.6*  MCV  88.8 88.6  --  89.1 88.8  PLT 273 232  --  194 183   Cardiac Enzymes: No results for input(s): CKTOTAL, CKMB, CKMBINDEX, TROPONINI in the last 168 hours. BNP (last 3 results) No results for input(s): BNP in the last 8760 hours.  ProBNP (last 3 results) No results for input(s): PROBNP in the last 8760 hours.  CBG: No results for input(s): GLUCAP in the last 168 hours.  Recent Results (from the past 240 hour(s))  Culture, blood (Routine X 2) w Reflex to ID Panel     Status: None (Preliminary result)   Collection Time: 06/05/15  5:19 PM  Result Value Ref Range Status   Specimen Description BLOOD LEFT HAND  Final   Special Requests BOTTLES DRAWN AEROBIC ONLY 5CC  Final   Culture NO GROWTH 1 DAY  Final   Report Status PENDING  Incomplete  Culture, blood (Routine X 2) w Reflex to ID Panel     Status: None (Preliminary result)   Collection Time: 06/05/15  5:25 PM  Result Value Ref Range  Status   Specimen Description BLOOD RIGHT HAND  Final   Special Requests BOTTLES DRAWN AEROBIC AND ANAEROBIC 5CC  Final   Culture NO GROWTH 1 DAY  Final   Report Status PENDING  Incomplete     Studies: No results found.  Scheduled Meds: . antiseptic oral rinse  7 mL Mouth Rinse q12n4p  . azithromycin  500 mg Intravenous Q24H  . chlorhexidine  15 mL Mouth Rinse BID  . traMADol  50 mg Oral Once   Continuous Infusions: . 0.9 % NaCl with KCl 20 mEq / L 50 mL/hr at 06/07/15 1222    Time spent: 25 minutes  [facility] [email or web address], password [facility] 06/07/2015, 9:51 PM  LOS: 2 days

## 2015-06-07 NOTE — Progress Notes (Signed)
Utilization Review Completed.Catherina Pates T3/08/2015  

## 2015-06-08 MED ORDER — HYDROCODONE-ACETAMINOPHEN 5-325 MG PO TABS: 1.0000 | ORAL_TABLET | Freq: Four times a day (QID) | ORAL | Status: AC | PRN

## 2015-06-08 MED ORDER — PROMETHAZINE HCL 12.5 MG PO TABS
12.5000 mg | ORAL_TABLET | Freq: Four times a day (QID) | ORAL | Status: DC | PRN
Start: 2015-06-08 — End: 2015-06-08

## 2015-06-08 MED ORDER — PROMETHAZINE HCL 12.5 MG PO TABS: 12.5000 mg | ORAL_TABLET | Freq: Four times a day (QID) | ORAL | Status: DC | PRN

## 2015-06-08 NOTE — Progress Notes (Signed)
Patient received pamphlet from [facility]. CM explained to patient that they may use the on site pharmacy to fill prescriptions given to them at discharge. Patient aware that the [facility] will not fill narcotics or pain medications prior to the patient being seen by one of their physicians.  Patient aware that they must be seen as a patient prior to the pharmacy filling the prescriptions a second time.  

## 2015-06-08 NOTE — Progress Notes (Signed)
Nsg Discharge Note  Admit Date:  06/05/2015 Discharge date: 06/08/2015   Shevawn Hofer to be D/C'd Home per MD order.  AVS completed.  Copy for chart, and copy for patient signed, and dated. Patient/caregiver able to verbalize understanding.  Discharge Medication:   Medication List    STOP taking these medications        acetaminophen-codeine 120-12 MG/5ML solution     cyclobenzaprine 10 MG tablet  Commonly known as:  FLEXERIL     GOODY HEADACHE PO     Guaifenesin 1200 MG Tb12     naproxen 375 MG tablet  Commonly known as:  NAPROSYN     traMADol 50 MG tablet  Commonly known as:  ULTRAM      TAKE these medications        acetaminophen 500 MG tablet  Commonly known as:  TYLENOL  Take 500 mg by mouth every 6 (six) hours as needed for mild pain.     HYDROcodone-acetaminophen 5-325 MG tablet  Commonly known as:  NORCO/VICODIN  Take 1 tablet by mouth every 6 (six) hours as needed.     promethazine 12.5 MG tablet  Commonly known as:  PHENERGAN  Take 1 tablet (12.5 mg total) by mouth every 6 (six) hours as needed for nausea or vomiting.        Discharge Assessment: Filed Vitals:   06/07/15 2149 06/08/15 0503  BP: 100/59 103/53  Pulse: 78 65  Temp: 98.2 F (36.8 C) 98.5 F (36.9 C)  Resp: 16 16   Skin clean, dry and intact without evidence of skin break down, no evidence of skin tears noted. IV catheter discontinued intact. Site without signs and symptoms of complications - no redness or edema noted at insertion site, patient denies c/o pain - only slight tenderness at site.  Dressing with slight pressure applied.  D/c Instructions-Education: Discharge instructions given to patient/family with verbalized understanding. D/c education completed with patient/family including follow up instructions, medication list, d/c activities limitations if indicated, with other d/c instructions as indicated by MD - patient able to verbalize understanding, all questions fully  answered. Patient instructed to return to ED, call 911, or call MD for any changes in condition.  Patient escorted via [place], and D/C home via private auto.  Salley Slaughter, RN 06/08/2015 12:10 PM

## 2015-06-08 NOTE — Discharge Summary (Signed)
Physician Discharge Summary  [name] [id number] DOB: [birth year] DOA: 06/05/2015  PCP: [facility] [facility] date: 06/05/2015 Discharge date: 06/08/2015  Time spent: greater than 30 minutes   Discharge Diagnoses:  Principal Problem:   Bloody diarrhea/infectious colitis Active Problems:   Acute blood loss anemia   Headache   Discharge Condition: stable  Diet recommendation: regular  Filed Weights   06/05/15 2018  Weight: 74.2 kg (163 lb 9.3 oz)    History of present illness:  45 year old female patient with history of PTSD and depression as well as panic disorder, recurrent migraines and sciatica. Patient reports that on Wednesday evening she went out to eat and consumed wings and some alcoholic beverages. About 1-1/2 hours later she began having recurrent vomiting. Eventually she was able to go to sleep but was awakened around 4 AM initially with explosive watery diarrhea and abdominal cramping which later developed into bloody diarrhea. He was also associated with suprapubic pain. Symptoms persisted and the pain became more intense and she describes it as being very sharp like glass passing through her intestines. At this point the diarrhea had changed to bloody. At this point she suspected food poisoning since she has previously eaten undercooked chicken at this particular restaurant in the past. By Thursday she eventually sought treatment at the [facility] but became discouraged at the long wait and eventually went back home. Her symptoms seemed to subside briefly but once again early this morning around 4 AM she awakened with severe pain again but more colicky in nature and had localized more to the left abdomen she began having blood once again. She also describes fevers of 102.3 with chills and myalgias and rigors. She has not had any influenza symptoms such as headache rhinorrhea or cough. She called EMS and was transported to [facility] and eventually transferred to [facility] ER  for direct admission. She was not admitted directly to a bed due to lack of availability and instead sent to the ER because of presenting symptomatology of lower GI bleeding with hemoglobin rapidly trending downward  Hospital Course:  Admitted to medsurg. Started on empiric antibiotics. GI consulted and did not recommend endoscopy, as bleeding stopped spontaneously, and h/h, blood pressure remained stable. Diet slowly advanced. By discharge, feeling much better and requesting discharge. Completed 3 days antibiotics  During hospitalization, developed headache atypical for previous migraines. Started after cipro. Changed to azithromycin with resolution of headache.  Procedures:  none  Consultations:  [facility]  Discharge Exam: Filed Vitals:   06/07/15 2149 06/08/15 0503  BP: 100/59 103/53  Pulse: 78 65  Temp: 98.2 F (36.8 C) 98.5 F (36.9 C)  Resp: 16 16    General: a and o Cardiovascular:RRR Respiratory: CTA abd : S, NT, ND  Discharge Instructions   Discharge Instructions    Increase activity slowly    Complete by:  As directed           Current Discharge Medication List    START taking these medications   Details  HYDROcodone-acetaminophen (NORCO/VICODIN) 5-325 MG tablet Take 1 tablet by mouth every 6 (six) hours as needed. Qty: 20 tablet, Refills: 0    promethazine (PHENERGAN) 12.5 MG tablet Take 1 tablet (12.5 mg total) by mouth every 6 (six) hours as needed for nausea or vomiting. Qty: 15 tablet, Refills: 0      CONTINUE these medications which have NOT CHANGED   Details  acetaminophen (TYLENOL) 500 MG tablet Take 500 mg by mouth every 6 (  six) hours as needed for mild pain.      STOP taking these medications     Aspirin-Acetaminophen-Caffeine (GOODY HEADACHE PO)      acetaminophen-codeine 120-12 MG/5ML solution      cyclobenzaprine (FLEXERIL) 10 MG tablet      Guaifenesin 1200 MG TB12      naproxen (NAPROSYN) 375 MG tablet      traMADol (ULTRAM)  50 MG tablet        Allergies  Allergen Reactions  . Benadryl [Diphenhydramine Hcl] Anxiety   Follow-up Information    Follow up with [facility]. Call today.   Specialty:  Family Medicine   Contact information:   [place] [id number] [phone]       Schedule an appointment as soon as possible for a visit with [place].   Why:  Call in a few days to schedule appointment. Clinic will fill your meds at discharge from the hospital if you would like.    Contact information:   [address] [place] [phone]       The results of significant diagnostics from this hospitalization (including imaging, microbiology, ancillary and laboratory) are listed below for reference.    Significant Diagnostic Studies: Ct Abdomen Pelvis W Contrast  06/05/2015  CLINICAL DATA:  Followup for hematochezia. EXAM: CT ABDOMEN AND PELVIS WITH CONTRAST TECHNIQUE: Multidetector CT imaging of the abdomen and pelvis was performed using the standard protocol following bolus administration of intravenous contrast. CONTRAST:  169mL OMNIPAQUE IOHEXOL 300 MG/ML  SOLN COMPARISON:  None. FINDINGS: Lung bases:  Clear.  Heart normal in size. Liver:  16 mm cyst in the caudate lobe.  Otherwise unremarkable. Spleen, gallbladder, pancreas, adrenal glands:  Unremarkable. Kidneys, ureters, bladder:  Unremarkable. Lymph nodes:  No pathologically enlarged lymph nodes. Ascites:  Trace free fluid in the posterior pelvic recess. Gastrointestinal: There is wall thickening of the descending colon extending to the proximal sigmoid. There is mild associated mesenteric inflammatory stranding and a trace amount of fluid along the left posterior pericolic gutter. Remainder of the colon is unremarkable. Stomach and small bowel are unremarkable. Normal appendix. Musculoskeletal: Disc degenerative changes at L5-S1. No osteoblastic or osteolytic  lesions. IMPRESSION: 1. Colon wall thickening with associated inflammation along the descending and proximal sigmoid colon consistent with an infectious or inflammatory colitis. 2. No other acute findings.  Template Electronically Signed   By: Lajean Manes M.D.   On: 06/05/2015 18:09    Microbiology: Recent Results (from the past 240 hour(s))  Culture, blood (Routine X 2) w Reflex to ID Panel     Status: None (Preliminary result)   Collection Time: 06/05/15  5:19 PM  Result Value Ref Range Status   Specimen Description BLOOD LEFT HAND  Final   Special Requests BOTTLES DRAWN AEROBIC ONLY 5CC  Final   Culture NO GROWTH 1 DAY  Final   Report Status PENDING  Incomplete  Culture, blood (Routine X 2) w Reflex to ID Panel     Status: None (Preliminary result)   Collection Time: 06/05/15  5:25 PM  Result Value Ref Range Status   Specimen Description BLOOD RIGHT HAND  Final   Special Requests BOTTLES DRAWN AEROBIC AND ANAEROBIC 5CC  Final   Culture NO GROWTH 1 DAY  Final   Report Status PENDING  Incomplete     Labs: Basic Metabolic Panel:  Recent Labs Lab 06/04/15 1855 06/05/15 0620 06/05/15 1903 06/05/15 2248  NA 135 138  --  137  K 3.8 3.6  --  3.5  CL 102 102  --  106  CO2 22 27  --  21*  GLUCOSE 121* 114*  --  94  BUN 17 14  --  9  CREATININE 0.68 0.75  --  0.70  CALCIUM 9.6 8.8*  --  7.7*  MG  --   --  1.5*  --   PHOS  --   --  2.7  --    Liver Function Tests:  Recent Labs Lab 06/04/15 1855 06/05/15 0620 06/05/15 2248  AST 18 17 14*  ALT 14 12* 10*  ALKPHOS 61 58 44  BILITOT 1.3* 1.3* 0.9  PROT 7.0 6.4* 5.2*  ALBUMIN 4.3 3.9 2.9*    Recent Labs Lab 06/04/15 1855  LIPASE 20   No results for input(s): AMMONIA in the last 168 hours. CBC:  Recent Labs Lab 06/04/15 1855 06/05/15 0620 06/05/15 1300 06/05/15 1743 06/05/15 2248  WBC 15.8* 12.1*  --  9.8 8.3  NEUTROABS  --  8.6*  --   --   --   HGB 14.1 13.1 10.9* 11.7* 10.6*  HCT 41.4 37.2 31.7* 33.4*  31.6*  MCV 88.8 88.6  --  89.1 88.8  PLT 273 232  --  194 183   Cardiac Enzymes: No results for input(s): CKTOTAL, CKMB, CKMBINDEX, TROPONINI in the last 168 hours. BNP: BNP (last 3 results) No results for input(s): BNP in the last 8760 hours.  ProBNP (last 3 results) No results for input(s): PROBNP in the last 8760 hours.  CBG: No results for input(s): GLUCAP in the last 168 hours.     Signed:  Delfina Redwood MD [facility] 06/08/2015, 11:57 AM

## 2015-06-08 NOTE — Care Management Note (Signed)
Case Management Note  Patient Details  Name: [name] MRN: [id number] Date of Birth: Aug 12, 1970  Subjective/Objective:                 Spoke with patient at the bedside. Patient independent, has medicare and medicaid. Patient states that she is unable to go to the general med clinic bc they have her as being active with [facility]. Patient has tried to get this resolved with [facility] to no avail. Patient referred to [facility]. Unable to schedule appointment at thistime. Patient instructed to call in a few days or go to walk in clinic times.  Action/Plan:  NO HH needs.  Expected Discharge Date:                  Expected Discharge Plan:  Home/Self Care  In-House Referral:     Discharge planning Services  CM Consult, [facility]  Post Acute Care Choice:  NA Choice offered to:  Patient  DME Arranged:    DME Agency:     HH Arranged:    [place] Agency:     Status of Service:  Completed, signed off  Medicare Important Message Given:    Date Medicare IM Given:    Medicare IM give by:    Date Additional Medicare IM Given:    Additional Medicare Important Message give by:     If discussed at [place] of Stay Meetings, dates discussed:    Additional Comments:  Carles Collet, RN 06/08/2015, 11:55 AM

## 2015-06-08 NOTE — Care Management Important Message (Signed)
Important Message  Patient Details  Name: [name] MRN: [id number] Date of Birth: [birth year]   Medicare Important Message Given:  Yes    Carles Collet, RN 06/08/2015, 11:59 AMImportant Message  Patient Details  Name: [name] MRN: [id number] Date of Birth: 06-18-1970   Medicare Important Message Given:  Yes    Carles Collet, RN 06/08/2015, 11:58 AM

## 2015-06-11 LAB — CULTURE, BLOOD (ROUTINE X 2)
CULTURE: NO GROWTH
Culture: NO GROWTH

## 2015-09-06 ENCOUNTER — Emergency Department (HOSPITAL_COMMUNITY)
Admission: EM | Admit: 2015-09-06 | Discharge: 2015-09-06 | Disposition: A | Discharge status: Home / Self Care | Payer: Medicare Other | Attending: Emergency Medicine | Admitting: Emergency Medicine

## 2015-09-06 ENCOUNTER — Encounter (HOSPITAL_COMMUNITY): Payer: Self-pay | Admitting: Emergency Medicine

## 2015-09-06 ENCOUNTER — Emergency Department (HOSPITAL_COMMUNITY): Payer: Medicare Other

## 2015-09-06 DIAGNOSIS — Z85828 Personal history of other malignant neoplasm of skin: Secondary | ICD-10-CM | POA: Insufficient documentation

## 2015-09-06 DIAGNOSIS — Y939 Activity, unspecified: Secondary | ICD-10-CM | POA: Insufficient documentation

## 2015-09-06 DIAGNOSIS — Z79891 Long term (current) use of opiate analgesic: Secondary | ICD-10-CM | POA: Diagnosis not present

## 2015-09-06 DIAGNOSIS — Y999 Unspecified external cause status: Secondary | ICD-10-CM | POA: Insufficient documentation

## 2015-09-06 DIAGNOSIS — Z79899 Other long term (current) drug therapy: Secondary | ICD-10-CM | POA: Insufficient documentation

## 2015-09-06 DIAGNOSIS — M5441 Lumbago with sciatica, right side: Secondary | ICD-10-CM | POA: Diagnosis not present

## 2015-09-06 DIAGNOSIS — M549 Dorsalgia, unspecified: Secondary | ICD-10-CM | POA: Diagnosis present

## 2015-09-06 DIAGNOSIS — M79602 Pain in left arm: Secondary | ICD-10-CM | POA: Diagnosis not present

## 2015-09-06 DIAGNOSIS — M533 Sacrococcygeal disorders, not elsewhere classified: Secondary | ICD-10-CM

## 2015-09-06 DIAGNOSIS — Y92410 Unspecified street and highway as the place of occurrence of the external cause: Secondary | ICD-10-CM | POA: Diagnosis not present

## 2015-09-06 DIAGNOSIS — M5431 Sciatica, right side: Secondary | ICD-10-CM

## 2015-09-06 MED ORDER — TRAMADOL HCL 50 MG PO TABS: 50.0000 mg | ORAL_TABLET | Freq: Four times a day (QID) | ORAL | Status: DC | PRN

## 2015-09-06 NOTE — ED Notes (Signed)
PA at bedside.

## 2015-09-06 NOTE — Discharge Instructions (Signed)
Radicular Pain °Radicular pain in either the arm or leg is usually from a bulging or herniated disk in the spine. A piece of the herniated disk may press against the nerves as the nerves exit the spine. This causes pain which is felt at the tips of the nerves down the arm or leg. Other causes of radicular pain may include: °· Fractures. °· Heart disease. °· Cancer. °· An abnormal and usually degenerative state of the nervous system or nerves (neuropathy). °Diagnosis may require CT or MRI scanning to determine the primary cause.  °Nerves that start at the neck (nerve roots) may cause radicular pain in the outer shoulder and arm. It can spread down to the thumb and fingers. The symptoms vary depending on which nerve root has been affected. In most cases radicular pain improves with conservative treatment. Neck problems may require physical therapy, a neck collar, or cervical traction. Treatment may take many weeks, and surgery may be considered if the symptoms do not improve.  °Conservative treatment is also recommended for sciatica. Sciatica causes pain to radiate from the lower back or buttock area down the leg into the foot. Often there is a history of back problems. Most patients with sciatica are better after 2 to 4 weeks of rest and other supportive care. Short term bed rest can reduce the disk pressure considerably. Sitting, however, is not a good position since this increases the pressure on the disk. You should avoid bending, lifting, and all other activities which make the problem worse. Traction can be used in severe cases. Surgery is usually reserved for patients who do not improve within the first months of treatment. °Only take over-the-counter or prescription medicines for pain, discomfort, or fever as directed by your caregiver. Narcotics and muscle relaxants may help by relieving more severe pain and spasm and by providing mild sedation. Cold or massage can give significant relief. Spinal manipulation  is not recommended. It can increase the degree of disc protrusion. Epidural steroid injections are often effective treatment for radicular pain. These injections deliver medicine to the spinal nerve in the space between the protective covering of the spinal cord and back bones (vertebrae). Your caregiver can give you more information about steroid injections. These injections are most effective when given within two weeks of the onset of pain.  °You should see your caregiver for follow up care as recommended. A program for neck and back injury rehabilitation with stretching and strengthening exercises is an important part of management.  °SEEK IMMEDIATE MEDICAL CARE IF: °· You develop increased pain, weakness, or numbness in your arm or leg. °· You develop difficulty with bladder or bowel control. °· You develop abdominal pain. °  °This information is not intended to replace advice given to you by your health care provider. Make sure you discuss any questions you have with your health care provider. °  °Document Released: 04/28/2004 Document Revised: 04/11/2014 Document Reviewed: 10/15/2014 °[employer] Interactive Patient Education ©2016 [employer] ° °Sciatica With Rehab °The sciatic nerve runs from the back down the leg and is responsible for sensation and control of the muscles in the back (posterior) side of the thigh, lower leg, and foot. Sciatica is a condition that is characterized by inflammation of this nerve.  °SYMPTOMS  °· Signs of nerve damage, including numbness and/or weakness along the posterior side of the lower extremity. °· Pain in the back of the thigh that may also travel down the leg. °· Pain that worsens when sitting for   long periods of time. °· Occasionally, pain in the back or buttock. °CAUSES  °Inflammation of the sciatic nerve is the cause of sciatica. The inflammation is due to something irritating the nerve. Common sources of irritation include: °· Sitting for long periods of  time. °· Direct trauma to the nerve. °· Arthritis of the spine. °· Herniated or ruptured disk. °· Slipping of the vertebrae (spondylolisthesis). °· Pressure from soft tissues, such as muscles or ligament-like tissue (fascia). °RISK INCREASES WITH: °· Sports that place pressure or stress on the spine (football or weightlifting). °· Poor strength and flexibility. °· Failure to warm up properly before activity. °· Family history of low back pain or disk disorders. °· Previous back injury or surgery. °· Poor body mechanics, especially when lifting, or poor posture. °PREVENTION  °· Warm up and stretch properly before activity. °· Maintain physical fitness: °¨ Strength, flexibility, and endurance. °· Cardiovascular fitness. °· Learn and use proper technique, especially with posture and lifting. When possible, have coach correct improper technique. °· Avoid activities that place stress on the spine. °PROGNOSIS °If treated properly, then sciatica usually resolves within 6 weeks. However, occasionally surgery is necessary.  °RELATED COMPLICATIONS  °· Permanent nerve damage, including pain, numbness, tingle, or weakness. °· Chronic back pain. °· Risks of surgery: infection, bleeding, nerve damage, or damage to surrounding tissues. °TREATMENT °Treatment initially involves resting from any activities that aggravate your symptoms. The use of ice and medication may help reduce pain and inflammation. The use of strengthening and stretching exercises may help reduce pain with activity. These exercises may be performed at home or with referral to a therapist. A therapist may recommend further treatments, such as transcutaneous electronic nerve stimulation (TENS) or ultrasound. Your caregiver may recommend corticosteroid injections to help reduce inflammation of the sciatic nerve. If symptoms persist despite non-surgical (conservative) treatment, then surgery may be recommended. °MEDICATION °· If pain medication is necessary, then  nonsteroidal anti-inflammatory medications, such as aspirin and ibuprofen, or other minor pain relievers, such as acetaminophen, are often recommended. °· Do not take pain medication for 7 days before surgery. °· Prescription pain relievers may be given if deemed necessary by your caregiver. Use only as directed and only as much as you need. °· Ointments applied to the skin may be helpful. °· Corticosteroid injections may be given by your caregiver. These injections should be reserved for the most serious cases, because they may only be given a certain number of times. °HEAT AND COLD °· Cold treatment (icing) relieves pain and reduces inflammation. Cold treatment should be applied for 10 to 15 minutes every 2 to 3 hours for inflammation and pain and immediately after any activity that aggravates your symptoms. Use ice packs or massage the area with a piece of ice (ice massage). °· Heat treatment may be used prior to performing the stretching and strengthening activities prescribed by your caregiver, physical therapist, or athletic trainer. Use a heat pack or soak the injury in warm water. °SEEK MEDICAL CARE IF: °· Treatment seems to offer no benefit, or the condition worsens. °· Any medications produce adverse side effects. °EXERCISES  °RANGE OF MOTION (ROM) AND STRETCHING EXERCISES - Sciatica °Most people with sciatic will find that their symptoms worsen with either excessive bending forward (flexion) or arching at the low back (extension). The exercises which will help resolve your symptoms will focus on the opposite motion. Your physician, physical therapist or athletic trainer will help you determine which exercises will be most helpful to   resolve your low back pain. Do not complete any exercises without first consulting with your clinician. Discontinue any exercises which worsen your symptoms until you speak to your clinician. If you have pain, numbness or tingling which travels down into your buttocks, leg or  foot, the goal of the therapy is for these symptoms to move closer to your back and eventually resolve. Occasionally, these leg symptoms will get better, but your low back pain may worsen; this is typically an indication of progress in your rehabilitation. Be certain to be very alert to any changes in your symptoms and the activities in which you participated in the 24 hours prior to the change. Sharing this information with your clinician will allow him/her to most efficiently treat your condition. °These exercises may help you when beginning to rehabilitate your injury. Your symptoms may resolve with or without further involvement from your physician, physical therapist or athletic trainer. While completing these exercises, remember:  °· Restoring tissue flexibility helps normal motion to return to the joints. This allows healthier, less painful movement and activity. °· An effective stretch should be held for at least 30 seconds. °· A stretch should never be painful. You should only feel a gentle lengthening or release in the stretched tissue. °FLEXION RANGE OF MOTION AND STRETCHING EXERCISES: °STRETCH - Flexion, Single Knee to Chest  °· Lie on a firm bed or floor with both legs extended in front of you. °· Keeping one leg in contact with the floor, bring your opposite knee to your chest. Hold your leg in place by either grabbing behind your thigh or at your knee. °· Pull until you feel a gentle stretch in your low back. Hold __________ seconds. °· Slowly release your grasp and repeat the exercise with the opposite side. °Repeat __________ times. Complete this exercise __________ times per day.  °STRETCH - Flexion, Double Knee to Chest °· Lie on a firm bed or floor with both legs extended in front of you. °· Keeping one leg in contact with the floor, bring your opposite knee to your chest. °· Tense your stomach muscles to support your back and then lift your other knee to your chest. Hold your legs in place by  either grabbing behind your thighs or at your knees. °· Pull both knees toward your chest until you feel a gentle stretch in your low back. Hold __________ seconds. °· Tense your stomach muscles and slowly return one leg at a time to the floor. °Repeat __________ times. Complete this exercise __________ times per day.  °STRETCH - Low Trunk Rotation  °· Lie on a firm bed or floor. Keeping your legs in front of you, bend your knees so they are both pointed toward the ceiling and your feet are flat on the floor. °· Extend your arms out to the side. This will stabilize your upper body by keeping your shoulders in contact with the floor. °· Gently and slowly drop both knees together to one side until you feel a gentle stretch in your low back. Hold for __________ seconds. °· Tense your stomach muscles to support your low back as you bring your knees back to the starting position. Repeat the exercise to the other side. °Repeat __________ times. Complete this exercise __________ times per day  °EXTENSION RANGE OF MOTION AND FLEXIBILITY EXERCISES: °STRETCH - Extension, Prone on Elbows °· Lie on your stomach on the floor, a bed will be too soft. Place your palms about shoulder width apart and at the   height of your head. °· Place your elbows under your shoulders. If this is too painful, stack pillows under your chest. °· Allow your body to relax so that your hips drop lower and make contact more completely with the floor. °· Hold this position for __________ seconds. °· Slowly return to lying flat on the floor. °Repeat __________ times. Complete this exercise __________ times per day.  °RANGE OF MOTION - Extension, Prone Press Ups °· Lie on your stomach on the floor, a bed will be too soft. Place your palms about shoulder width apart and at the height of your head. °· Keeping your back as relaxed as possible, slowly straighten your elbows while keeping your hips on the floor. You may adjust the placement of your hands to  maximize your comfort. As you gain motion, your hands will come more underneath your shoulders. °· Hold this position __________ seconds. °· Slowly return to lying flat on the floor. °Repeat __________ times. Complete this exercise __________ times per day.  °STRENGTHENING EXERCISES - Sciatica  °These exercises may help you when beginning to rehabilitate your injury. These exercises should be done near your "sweet spot." This is the neutral, low-back arch, somewhere between fully rounded and fully arched, that is your least painful position. When performed in this safe range of motion, these exercises can be used for people who have either a flexion or extension based injury. These exercises may resolve your symptoms with or without further involvement from your physician, physical therapist or athletic trainer. While completing these exercises, remember:  °· Muscles can gain both the endurance and the strength needed for everyday activities through controlled exercises. °· Complete these exercises as instructed by your physician, physical therapist or athletic trainer. Progress with the resistance and repetition exercises only as your caregiver advises. °· You may experience muscle soreness or fatigue, but the pain or discomfort you are trying to eliminate should never worsen during these exercises. If this pain does worsen, stop and make certain you are following the directions exactly. If the pain is still present after adjustments, discontinue the exercise until you can discuss the trouble with your clinician. °STRENGTHENING - Deep Abdominals, Pelvic Tilt  °· Lie on a firm bed or floor. Keeping your legs in front of you, bend your knees so they are both pointed toward the ceiling and your feet are flat on the floor. °· Tense your lower abdominal muscles to press your low back into the floor. This motion will rotate your pelvis so that your tail bone is scooping upwards rather than pointing at your feet or into  the floor. °· With a gentle tension and even breathing, hold this position for __________ seconds. °Repeat __________ times. Complete this exercise __________ times per day.  °STRENGTHENING - Abdominals, Crunches  °· Lie on a firm bed or floor. Keeping your legs in front of you, bend your knees so they are both pointed toward the ceiling and your feet are flat on the floor. Cross your arms over your chest. °· Slightly tip your chin down without bending your neck. °· Tense your abdominals and slowly lift your trunk high enough to just clear your shoulder blades. Lifting higher can put excessive stress on the low back and does not further strengthen your abdominal muscles. °· Control your return to the starting position. °Repeat __________ times. Complete this exercise __________ times per day.  °STRENGTHENING - Quadruped, Opposite UE/LE Lift °· Assume a hands and knees position on a firm surface.   Keep your hands under your shoulders and your knees under your hips. You may place padding under your knees for comfort. °· Find your neutral spine and gently tense your abdominal muscles so that you can maintain this position. Your shoulders and hips should form a rectangle that is parallel with the floor and is not twisted. °· Keeping your trunk steady, lift your right hand no higher than your shoulder and then your left leg no higher than your hip. Make sure you are not holding your breath. Hold this position __________ seconds. °· Continuing to keep your abdominal muscles tense and your back steady, slowly return to your starting position. Repeat with the opposite arm and leg. °Repeat __________ times. Complete this exercise __________ times per day.  °STRENGTHENING - Abdominals and Quadriceps, Straight Leg Raise  °· Lie on a firm bed or floor with both legs extended in front of you. °· Keeping one leg in contact with the floor, bend the other knee so that your foot can rest flat on the floor. °· Find your neutral spine,  and tense your abdominal muscles to maintain your spinal position throughout the exercise. °· Slowly lift your straight leg off the floor about 6 inches for a count of 15, making sure to not hold your breath. °· Still keeping your neutral spine, slowly lower your leg all the way to the floor. °Repeat this exercise with each leg __________ times. Complete this exercise __________ times per day. °POSTURE AND BODY MECHANICS CONSIDERATIONS - Sciatica °Keeping correct posture when sitting, standing or completing your activities will reduce the stress put on different body tissues, allowing injured tissues a chance to heal and limiting painful experiences. The following are general guidelines for improved posture. Your physician or physical therapist will provide you with any instructions specific to your needs. While reading these guidelines, remember: °· The exercises prescribed by your provider will help you have the flexibility and strength to maintain correct postures. °· The correct posture provides the optimal environment for your joints to work. All of your joints have less wear and tear when properly supported by a spine with good posture. This means you will experience a healthier, less painful body. °· Correct posture must be practiced with all of your activities, especially prolonged sitting and standing. Correct posture is as important when doing repetitive low-stress activities (typing) as it is when doing a single heavy-load activity (lifting). °RESTING POSITIONS °Consider which positions are most painful for you when choosing a resting position. If you have pain with flexion-based activities (sitting, bending, stooping, squatting), choose a position that allows you to rest in a less flexed posture. You would want to avoid curling into a fetal position on your side. If your pain worsens with extension-based activities (prolonged standing, working overhead), avoid resting in an extended position such as  sleeping on your stomach. Most people will find more comfort when they rest with their spine in a more neutral position, neither too rounded nor too arched. Lying on a non-sagging bed on your side with a pillow between your knees, or on your back with a pillow under your knees will often provide some relief. Keep in mind, being in any one position for a prolonged period of time, no matter how correct your posture, can still lead to stiffness. °PROPER SITTING POSTURE °In order to minimize stress and discomfort on your spine, you must sit with correct posture Sitting with good posture should be effortless for a healthy body. Returning to good   posture is a gradual process. Many people can work toward this most comfortably by using various supports until they have the flexibility and strength to maintain this posture on their own. °When sitting with proper posture, your ears will fall over your shoulders and your shoulders will fall over your hips. You should use the back of the chair to support your upper back. Your low back will be in a neutral position, just slightly arched. You may place a small pillow or folded towel at the base of your low back for support.  °When working at a desk, create an environment that supports good, upright posture. Without extra support, muscles fatigue and lead to excessive strain on joints and other tissues. Keep these recommendations in mind: °CHAIR:  °· A chair should be able to slide under your desk when your back makes contact with the back of the chair. This allows you to work closely. °· The chair's height should allow your eyes to be level with the upper part of your monitor and your hands to be slightly lower than your elbows. °BODY POSITION °· Your feet should make contact with the floor. If this is not possible, use a foot rest. °· Keep your ears over your shoulders. This will reduce stress on your neck and low back. °INCORRECT SITTING POSTURES  °· If you are feeling tired and  unable to assume a healthy sitting posture, do not slouch or slump. This puts excessive strain on your back tissues, causing more damage and pain. Healthier options include: °· Using more support, like a lumbar pillow. °· Switching tasks to something that requires you to be upright or walking. °· Talking a brief walk. °· Lying down to rest in a neutral-spine position. °PROLONGED STANDING WHILE SLIGHTLY LEANING FORWARD  °When completing a task that requires you to lean forward while standing in one place for a long time, place either foot up on a stationary 2-4 inch high object to help maintain the best posture. When both feet are on the ground, the low back tends to lose its slight inward curve. If this curve flattens (or becomes too large), then the back and your other joints will experience too much stress, fatigue more quickly and can cause pain.  °CORRECT STANDING POSTURES °Proper standing posture should be assumed with all daily activities, even if they only take a few moments, like when brushing your teeth. As in sitting, your ears should fall over your shoulders and your shoulders should fall over your hips. You should keep a slight tension in your abdominal muscles to brace your spine. Your tailbone should point down to the ground, not behind your body, resulting in an over-extended swayback posture.  °INCORRECT STANDING POSTURES  °Common incorrect standing postures include a forward head, locked knees and/or an excessive swayback. °WALKING °Walk with an upright posture. Your ears, shoulders and hips should all line-up. °PROLONGED ACTIVITY IN A FLEXED POSITION °When completing a task that requires you to bend forward at your waist or lean over a low surface, try to find a way to stabilize 3 of 4 of your limbs. You can place a hand or elbow on your thigh or rest a knee on the surface you are reaching across. This will provide you more stability so that your muscles do not fatigue as quickly. By keeping your  knees relaxed, or slightly bent, you will also reduce stress across your low back. °CORRECT LIFTING TECHNIQUES °DO :  °· Assume a wide stance. This will   provide you more stability and the opportunity to get as close as possible to the object which you are lifting. °· Tense your abdominals to brace your spine; then bend at the knees and hips. Keeping your back locked in a neutral-spine position, lift using your leg muscles. Lift with your legs, keeping your back straight. °· Test the weight of unknown objects before attempting to lift them. °· Try to keep your elbows locked down at your sides in order get the best strength from your shoulders when carrying an object. °· Always ask for help when lifting heavy or awkward objects. °INCORRECT LIFTING TECHNIQUES °DO NOT:  °· Lock your knees when lifting, even if it is a small object. °· Bend and twist. Pivot at your feet or move your feet when needing to change directions. °· Assume that you cannot safely pick up a paperclip without proper posture. °  °This information is not intended to replace advice given to you by your health care provider. Make sure you discuss any questions you have with your health care provider. °  °Document Released: 03/21/2005 Document Revised: 08/05/2014 Document Reviewed: 07/03/2008 °[employer] Interactive Patient Education ©2016 [employer] ° °
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
## 2016-04-07 ENCOUNTER — Other Ambulatory Visit: Payer: Self-pay | Admitting: Internal Medicine

## 2016-07-26 ENCOUNTER — Encounter: Payer: Self-pay | Admitting: Obstetrics and Gynecology

## 2016-07-26 ENCOUNTER — Encounter: Payer: Self-pay | Admitting: *Deleted

## 2016-07-26 ENCOUNTER — Ambulatory Visit (INDEPENDENT_AMBULATORY_CARE_PROVIDER_SITE_OTHER): Payer: Medicare Other | Admitting: Obstetrics and Gynecology

## 2016-07-26 VITALS — BP 111/79 | HR 76 | Ht 65.0 in | Wt 168.1 lb

## 2016-07-26 DIAGNOSIS — R87622 Low grade squamous intraepithelial lesion on cytologic smear of vagina (LGSIL): Secondary | ICD-10-CM

## 2016-07-26 NOTE — Progress Notes (Signed)
46 yo with LGSIl on 04/2016 pap smear here for colposcopy. Patient with a history of abnormal pap smear s/p TAH in 2011  Patient given informed consent, signed copy in the chart, time out was performed.  Placed in lithotomy position.Vaginal cuff viewed with speculum and colposcope after application of acetic acid.   Colposcopy adequate?  yes Acetowhite lesions? no Punctation? no Mosaicism?  no Abnormal vasculature?  no Biopsies? no ECC? no  COMMENTS:  Patient was given post procedure instructions.  She will return in 6 months for repeat pap smear.  Tara Bellman, MD

## 2017-08-01 ENCOUNTER — Other Ambulatory Visit: Payer: Self-pay | Admitting: Obstetrics & Gynecology

## 2017-08-01 DIAGNOSIS — Z1231 Encounter for screening mammogram for malignant neoplasm of breast: Secondary | ICD-10-CM

## 2017-08-21 ENCOUNTER — Other Ambulatory Visit (HOSPITAL_COMMUNITY)
Admission: RE | Admit: 2017-08-21 | Discharge: 2017-08-21 | Disposition: A | Discharge status: Home / Self Care | Payer: Medicare Other | Source: Ambulatory Visit | Attending: Obstetrics | Admitting: Obstetrics

## 2017-08-21 ENCOUNTER — Encounter: Payer: Self-pay | Admitting: Obstetrics

## 2017-08-21 ENCOUNTER — Ambulatory Visit (INDEPENDENT_AMBULATORY_CARE_PROVIDER_SITE_OTHER): Payer: Medicare Other | Admitting: Obstetrics

## 2017-08-21 VITALS — BP 110/79 | HR 98 | Ht 65.0 in | Wt 163.1 lb

## 2017-08-21 DIAGNOSIS — R87622 Low grade squamous intraepithelial lesion on cytologic smear of vagina (LGSIL): Secondary | ICD-10-CM

## 2017-08-21 DIAGNOSIS — Z01419 Encounter for gynecological examination (general) (routine) without abnormal findings: Secondary | ICD-10-CM

## 2017-08-21 DIAGNOSIS — N898 Other specified noninflammatory disorders of vagina: Secondary | ICD-10-CM

## 2017-08-21 DIAGNOSIS — Z124 Encounter for screening for malignant neoplasm of cervix: Secondary | ICD-10-CM | POA: Diagnosis not present

## 2017-08-21 NOTE — Progress Notes (Signed)
Subjective:        [name] is a 47 y.o. female here for a routine exam.  Current complaints: NONE.    Personal health questionnaire:  Is patient [name], have a family history of breast and/or ovarian cancer: no Is there a family history of uterine cancer diagnosed at age < 55, gastrointestinal cancer, urinary tract cancer, family member who is a Field seismologist syndrome-associated carrier: yes Is the patient overweight and hypertensive, family history of diabetes, personal history of gestational diabetes, preeclampsia or PCOS: no Is patient over 32, have PCOS,  family history of premature CHD under age 79, diabetes, smoke, have hypertension or peripheral artery disease:  no At any time, has a partner hit, kicked or otherwise hurt or frightened you?: no Over the past 2 weeks, have you felt down, depressed or hopeless?: no Over the past 2 weeks, have you felt little interest or pleasure in doing things?:no   Gynecologic History No LMP recorded. Patient has had a hysterectomy. Contraception: status post hysterectomy Last Pap: 2018. Results were: LGSIL OF VAGINA Last mammogram: 2015. Results were: normal  Obstetric History OB History  No data available    Past Medical History:  Diagnosis Date  . Anxiety   . Bulging discs    neck/back  . Depression   . Dissociative disorder   . Endometriosis   . Knee problem    Left  . Migraine   . Night terrors   . Panic disorder (episodic paroxysmal anxiety)   . PTSD (post-traumatic stress disorder)   . Sciatica   . Skin cancer   . Tendonitis     Past Surgical History:  Procedure Laterality Date  . BREAST BIOPSY Left 2012  . HAND SURGERY Left    Grafting  . RADICAL HYSTERECTOMY  2011  . TUBAL LIGATION  2002     Current Outpatient Medications:  .  amphetamine-dextroamphetamine (ADDERALL) 7.5 MG tablet, Take by mouth., Disp: , Rfl:  .  clonazePAM (KLONOPIN) 1 MG tablet, Take by mouth., Disp: , Rfl:  .  traMADol (ULTRAM) 50  MG tablet, Take by mouth., Disp: , Rfl:  .  acetaminophen (TYLENOL) 500 MG tablet, Take 500 mg by mouth every 6 (six) hours as needed for mild pain., Disp: , Rfl:  .  HYDROcodone-acetaminophen (NORCO/VICODIN) 5-325 MG tablet, Take 1 tablet by mouth every 6 (six) hours as needed. (Patient not taking: Reported on 08/21/2017), Disp: 20 tablet, Rfl: 0 Allergies  Allergen Reactions  . Benadryl [Diphenhydramine Hcl] Anxiety    Social History   Tobacco Use  . Smoking status: Never Smoker  . Smokeless tobacco: Never Used  Substance Use Topics  . Alcohol use: No    Family History  Problem Relation Age of Onset  . Cancer Mother   . Leukemia Father   . Esophageal cancer Unknown   . Lung cancer Unknown   . Stroke Unknown   . Hypertension Unknown   . ALS Unknown   . Macular degeneration Unknown       Review of Systems  Constitutional: negative for fatigue and weight loss Respiratory: negative for cough and wheezing Cardiovascular: negative for chest pain, fatigue and palpitations Gastrointestinal: negative for abdominal pain and change in bowel habits Musculoskeletal:negative for myalgias Neurological: negative for gait problems and tremors Behavioral/Psych: negative for abusive relationship, depression Endocrine: negative for temperature intolerance    Genitourinary:negative for genital lesions, hot flashes, sexual problems and vaginal discharge Integument/breast: negative for breast lump, breast tenderness, nipple discharge and skin  lesion(s)    Objective:       BP 110/79   Pulse 98   Ht 5\' 5"  (1.651 m)   Wt 163 lb 1.6 oz (74 kg)   BMI 27.14 kg/m  General:   alert  Skin:   no rash or abnormalities  Lungs:   clear to auscultation bilaterally  Heart:   regular rate and rhythm, S1, S2 normal, no murmur, click, rub or gallop  Breasts:   normal without suspicious masses, skin or nipple changes or axillary nodes  Abdomen:  normal findings: no organomegaly, soft, non-tender and  no hernia  Pelvis:  External genitalia: normal general appearance Urinary system: urethral meatus normal and bladder without fullness, nontender Vaginal: normal without tenderness, induration or masses Cervix: absent Adnexa: not palpable on exam Uterus: absent   Lab Review Urine pregnancy test Labs reviewed yes Radiologic studies reviewed yes  50% of 20 min visit spent on counseling and coordination of care.   Assessment:     1. Encounter for well woman exam with routine gynecological exam  2. LGSIL Pap smear of vagina.  S/P TAH for ? cervical/uterine cancer, per patient's account.  Can't verify. Rx: - Cytology - PAP of Vagina - repeat pap in 6-12 months  3. Vaginal discharge Rx: - Cervicovaginal ancillary only   Plan:    Education reviewed: calcium supplements, depression evaluation, low fat, low cholesterol diet, safe sex/STD prevention, self breast exams, skin cancer screening and weight bearing exercise. Follow up in: 1 year.   No orders of the defined types were placed in this encounter.  No orders of the defined types were placed in this encounter.   Shelly Bombard MD 08-21-2017

## 2017-08-21 NOTE — Progress Notes (Signed)
Pt presents for annual, pap, and vaginal STD only. Pt c/o 3 lumps in R breast; possible appt today at 4 pm with [facility] per pt.

## 2017-08-22 LAB — CERVICOVAGINAL ANCILLARY ONLY
BACTERIAL VAGINITIS: NEGATIVE
Candida vaginitis: NEGATIVE
Trichomonas: NEGATIVE

## 2017-08-24 LAB — CYTOLOGY - PAP
HPV 16/18/45 genotyping: NEGATIVE
HPV: DETECTED — AB

## 2017-08-25 ENCOUNTER — Other Ambulatory Visit: Payer: Self-pay | Admitting: Obstetrics

## 2017-09-11 ENCOUNTER — Encounter: Payer: Self-pay | Admitting: Obstetrics

## 2017-09-11 ENCOUNTER — Ambulatory Visit (INDEPENDENT_AMBULATORY_CARE_PROVIDER_SITE_OTHER): Payer: Medicare Other | Admitting: Obstetrics

## 2017-09-11 ENCOUNTER — Encounter: Payer: Self-pay | Admitting: *Deleted

## 2017-09-11 DIAGNOSIS — R87622 Low grade squamous intraepithelial lesion on cytologic smear of vagina (LGSIL): Secondary | ICD-10-CM | POA: Diagnosis not present

## 2017-09-11 DIAGNOSIS — F411 Generalized anxiety disorder: Secondary | ICD-10-CM

## 2017-09-11 MED ORDER — CLONAZEPAM 0.5 MG PO TABS: 0.5000 mg | ORAL_TABLET | Freq: Every day | ORAL | 2 refills | Status: AC

## 2017-09-11 NOTE — Progress Notes (Signed)
    GYNECOLOGY CLINIC COLPOSCOPY PROCEDURE NOTE  47 y.o. No obstetric history on file. here for colposcopy for low-grade squamous intraepithelial neoplasia (LGSIL - encompassing HPV,mild dysplasia,CIN I), with Negative High Risk HPV pap smear on 08-21-2017.  She is s/p TAH for abnormal paps. Discussed role of HPV in dysplasia, need for surveillance.  Patient given informed consent, signed copy in the chart, time out was performed.  Placed in lithotomy position. Vaginal cuff viewed with speculum and colposcope after application of acetic acid.   Colposcopy adequate? Yes  no visible lesions, no mosaicism, no punctation and no abnormal vasculature.   Patient was given post procedure instructions.  Will follow up with pap smears yearly.  Routine preventative health maintenance measures emphasized.    Shelly Bombard MD, MD, [place] Attending [place], [facility] 09-11-2017

## 2017-09-11 NOTE — Progress Notes (Signed)
Pt presents for Colpo today.  Abnormal pap: VAIN-1 HPV LSIL

## 2017-11-19 IMAGING — CR DG SACRUM/COCCYX 2+V
3 series · 3 of 3 positions shown · non-contrast
Comparison: 09/05/2015

CLINICAL DATA: Tail bone pain

EXAM:
SACRUM AND COCCYX - 2+ VIEW

[t sacrum ap]
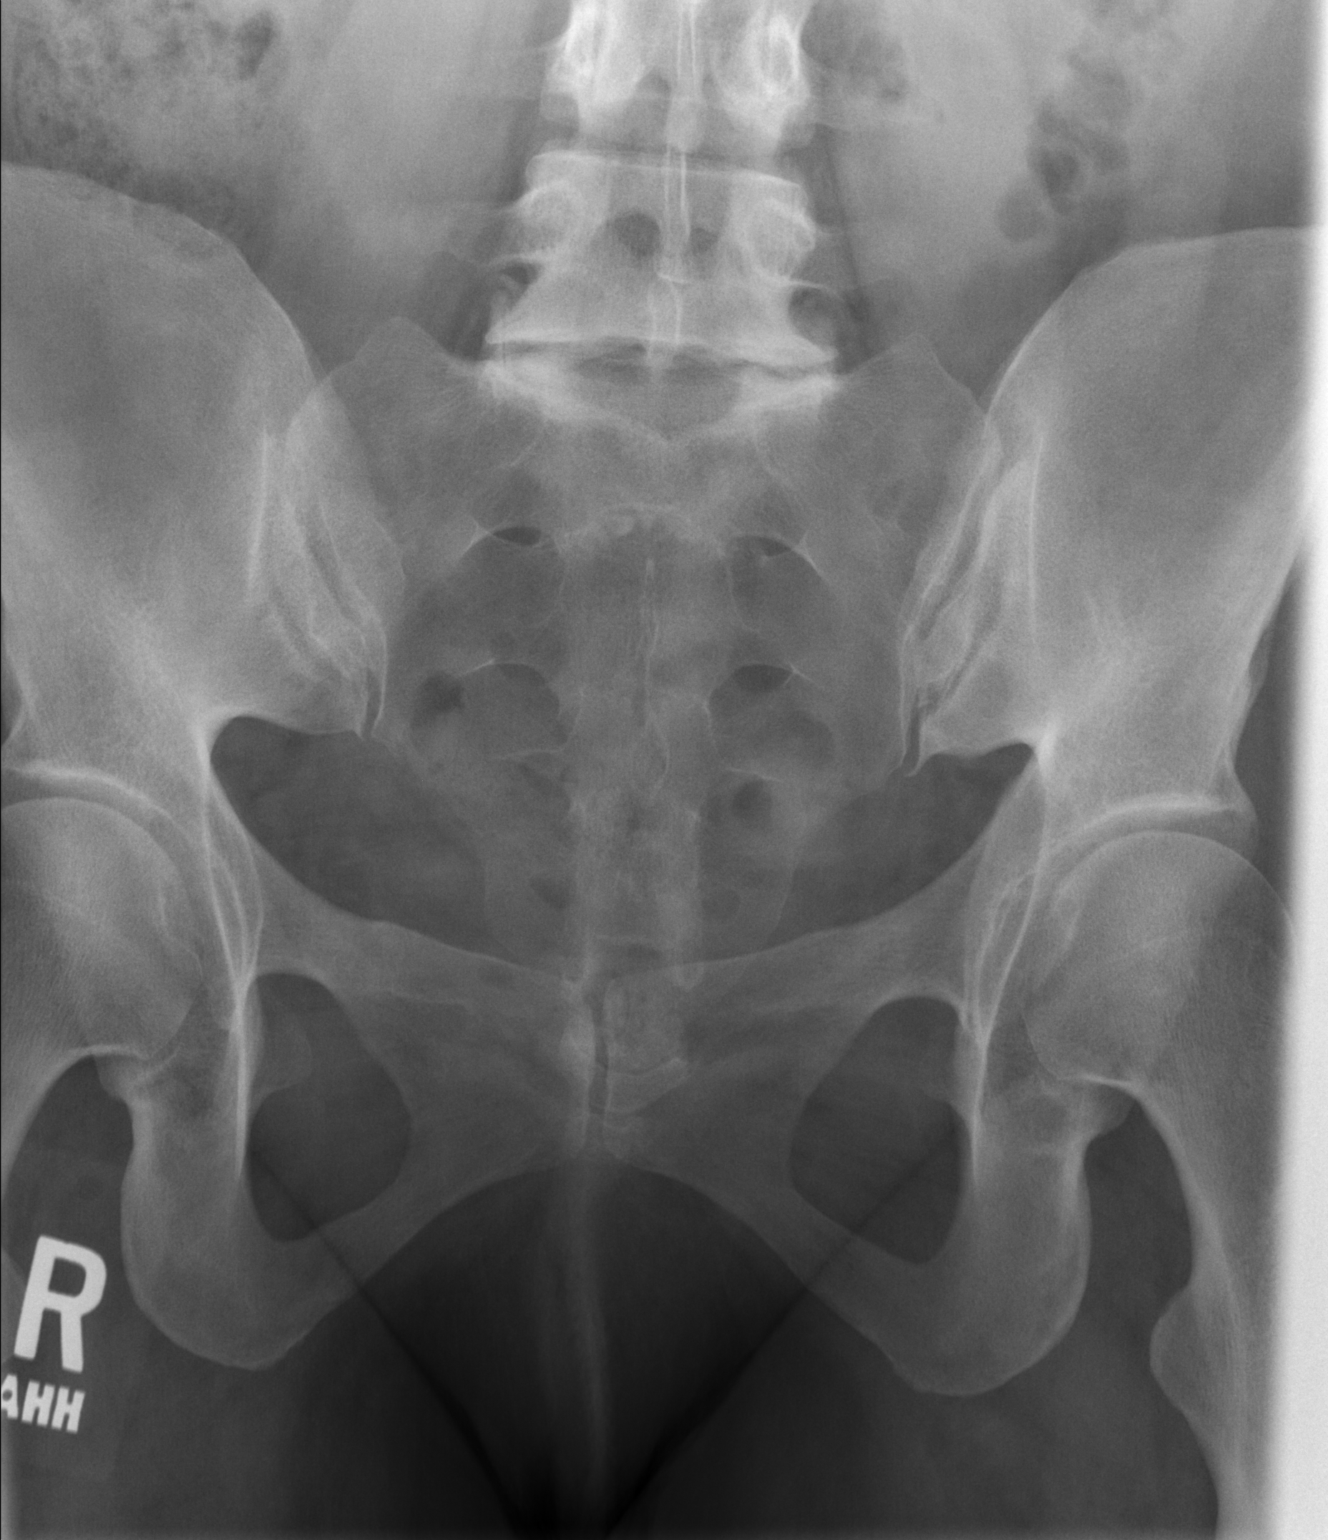

[t coccyx ap]
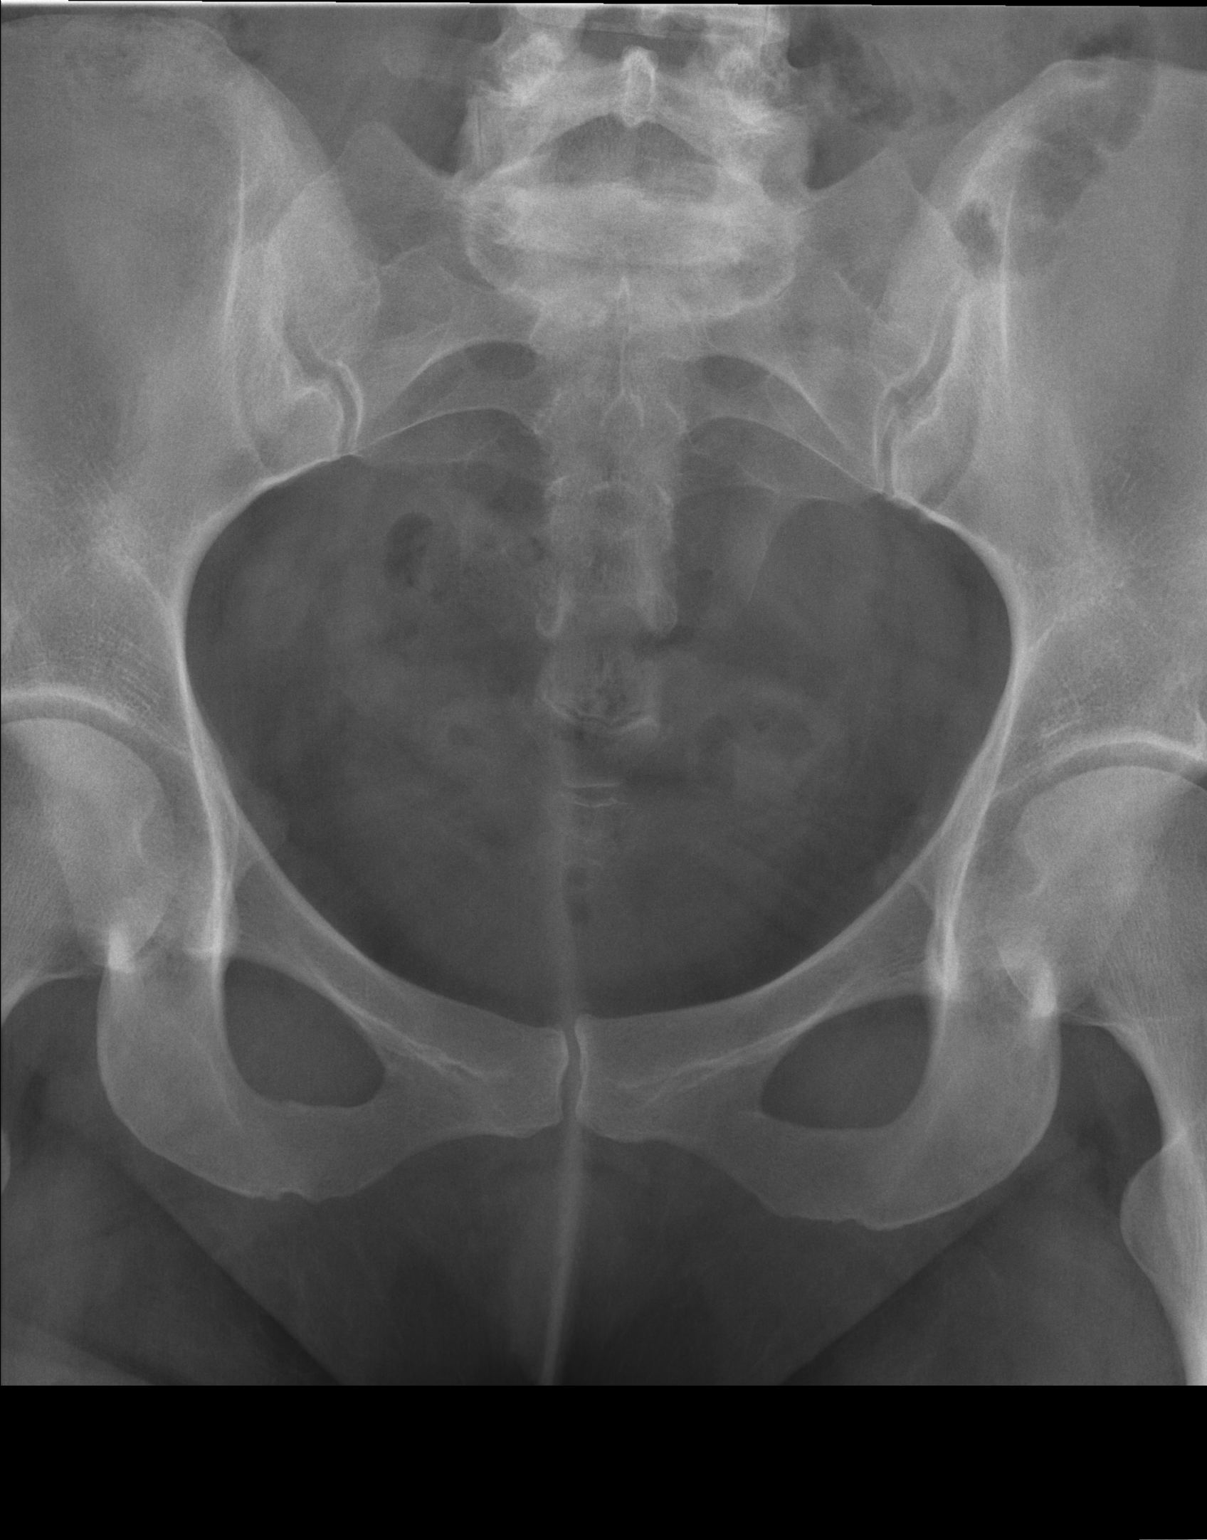

[t sacrum coccyx lat]
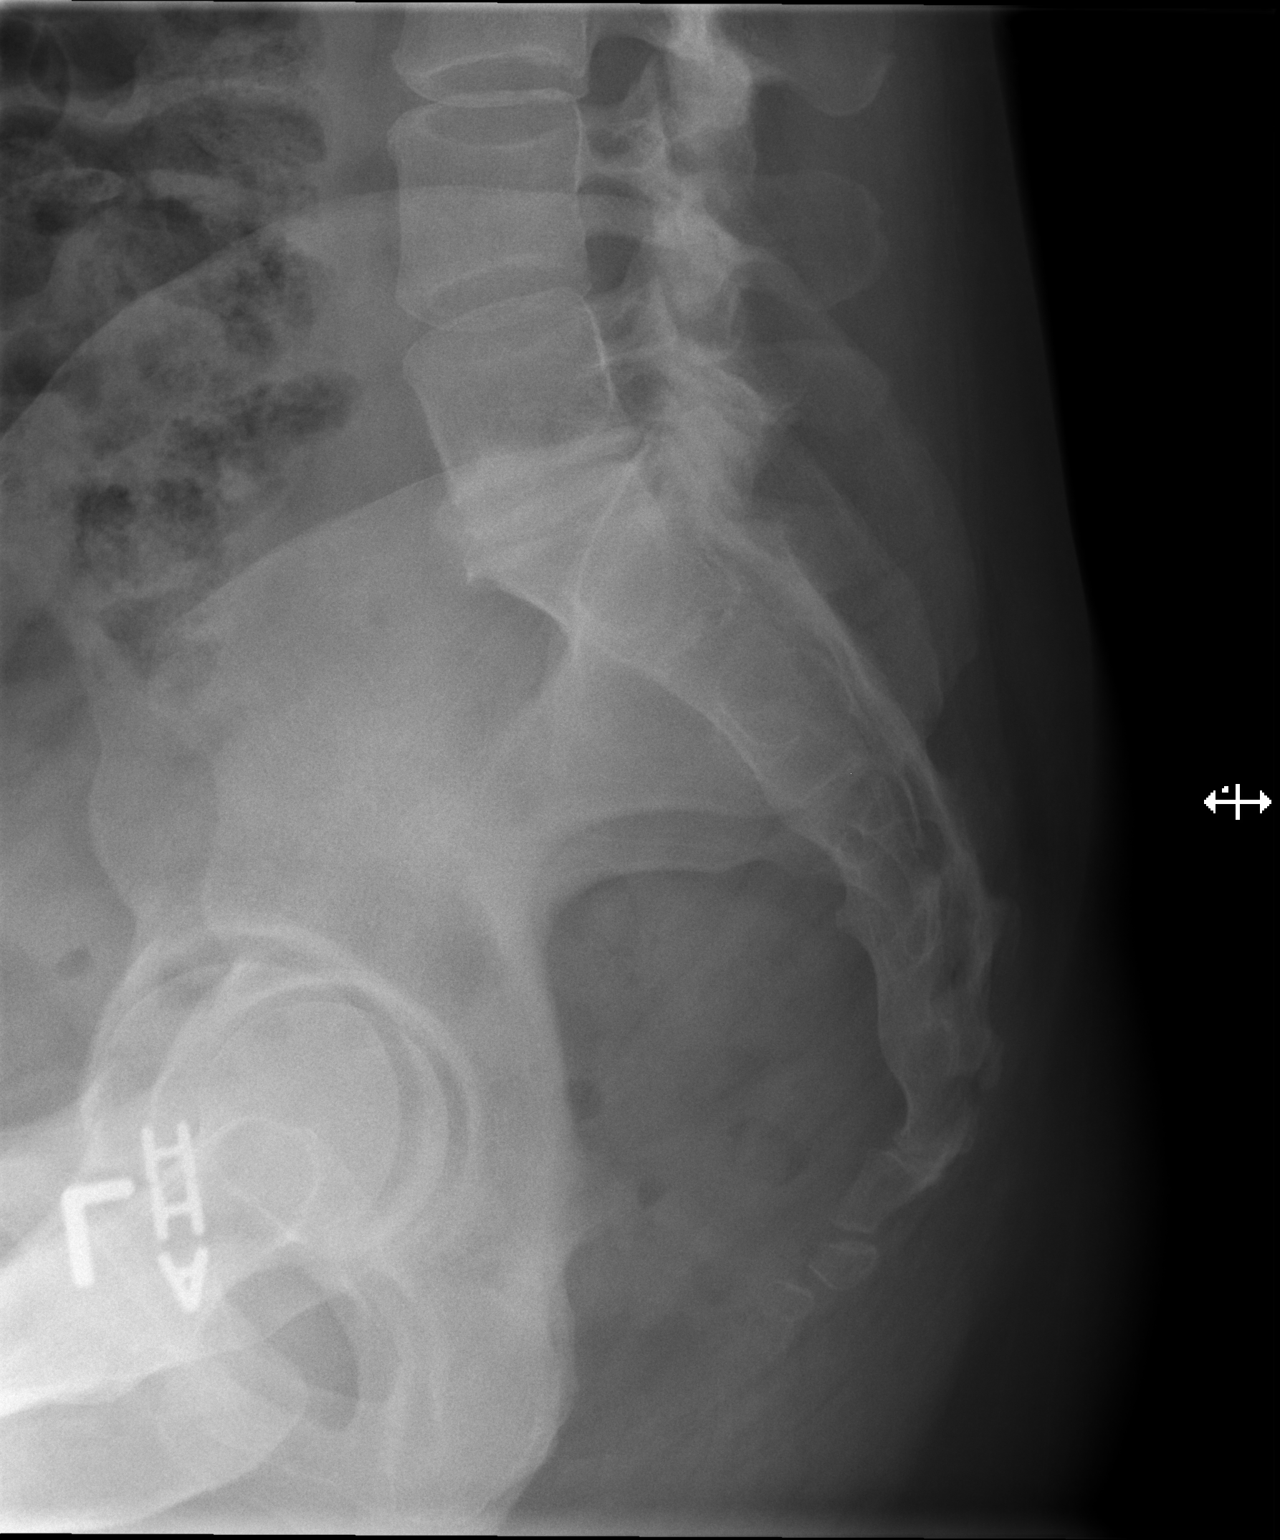

[3 of 3 positions shown; findings below may reference images not displayed]

FINDINGS: There is no evidence of fracture or other focal bone lesions. L5-S1
degenerative disc disease is identified.
IMPRESSION: 1. No acute findings.
2. L5-S1 degenerative disc disease.

## 2018-10-11 ENCOUNTER — Telehealth: Payer: Self-pay | Admitting: Diagnostic Neuroimaging

## 2018-10-11 NOTE — Telephone Encounter (Signed)
Sorry I sent the last message to you by mistake please desregard

## 2018-10-11 NOTE — Telephone Encounter (Signed)
This pt saw Dr Leta Baptist 2014 and is being referred back and would like to see you . Is it ok to switch?

## 2018-10-11 NOTE — Telephone Encounter (Signed)
You saw this pt 2014. She is being referred back to [place] and would like to see Dr Jaynee Eagles. Is it ok to schedule ?

## 2018-10-11 NOTE — Telephone Encounter (Signed)
Completely fine with me thanks!

## 2018-10-11 NOTE — Telephone Encounter (Signed)
This pt saw Dr Leta Baptist 2014 and is being referred back to [place] she would like to see you is it ok to switch?

## 2018-12-03 ENCOUNTER — Encounter: Payer: Self-pay | Admitting: Neurology

## 2018-12-03 ENCOUNTER — Telehealth: Payer: Self-pay | Admitting: *Deleted

## 2018-12-03 ENCOUNTER — Ambulatory Visit: Payer: Medicare Other | Admitting: Neurology

## 2018-12-03 NOTE — Telephone Encounter (Signed)
Pt no showed new pt appt on 12/03/2018.

## 2020-12-18 ENCOUNTER — Ambulatory Visit
Admission: RE | Admit: 2020-12-18 | Discharge: 2020-12-18 | Disposition: A | Discharge status: Home / Self Care | Payer: Medicare Other | Source: Ambulatory Visit | Attending: Orthopedic Surgery | Admitting: Orthopedic Surgery

## 2020-12-18 ENCOUNTER — Other Ambulatory Visit: Payer: Self-pay | Admitting: Orthopedic Surgery

## 2020-12-18 DIAGNOSIS — R222 Localized swelling, mass and lump, trunk: Secondary | ICD-10-CM

## 2023-03-03 IMAGING — CT CT CHEST W/O CM
3 of 7 series · 17 of 36 positions shown, 19 images · non-contrast
Comparison: None.

CLINICAL DATA: Lump near the upper sternum.

EXAM:
CT CHEST WITHOUT CONTRAST
TECHNIQUE: Multidetector CT imaging of the chest was performed following the
standard protocol without IV contrast.

[Series 8: chest 2.00 br40 s3 · axial · 0.68mm/px · z∈[+1656,+1912]mm · 8 of 166 slices shown, 10 images (1 of 2)]
[im 19/166  mediastinal]
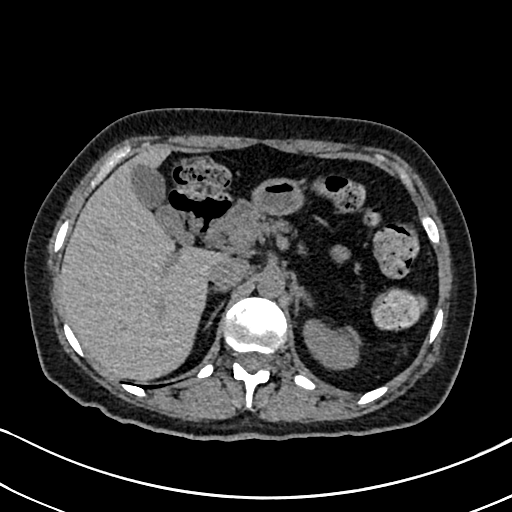
[im 19/166  lung]
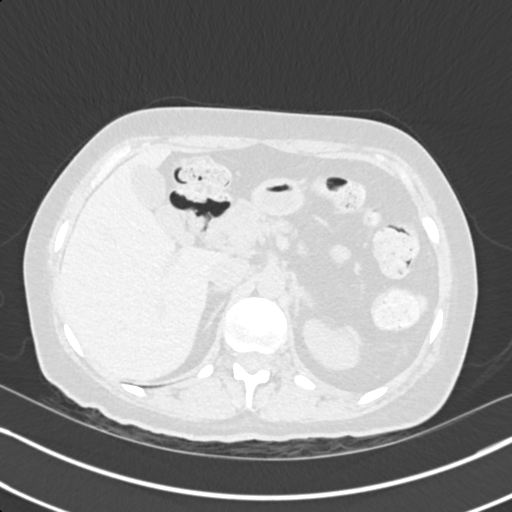
[im 37/166  lung]
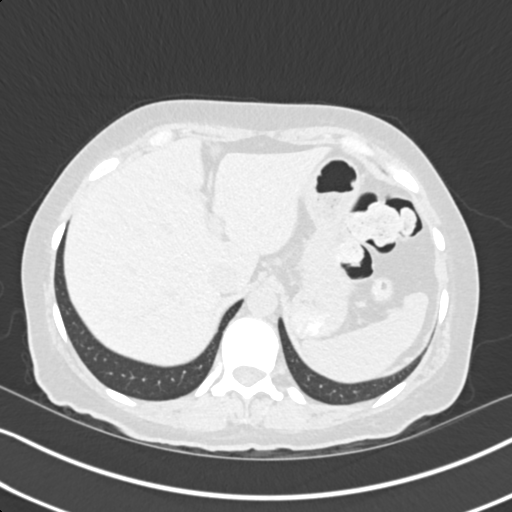
[im 56/166  lung]
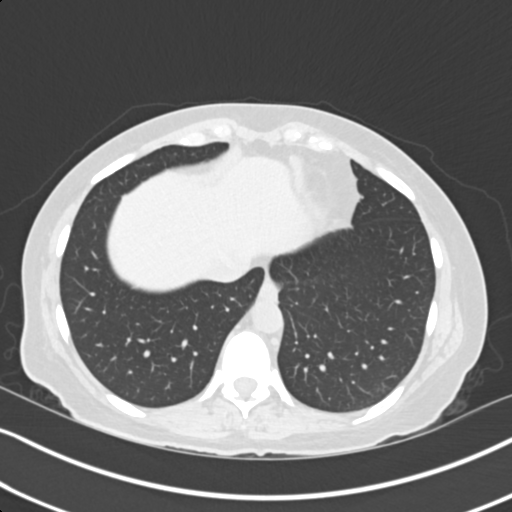
[im 74/166  lung]
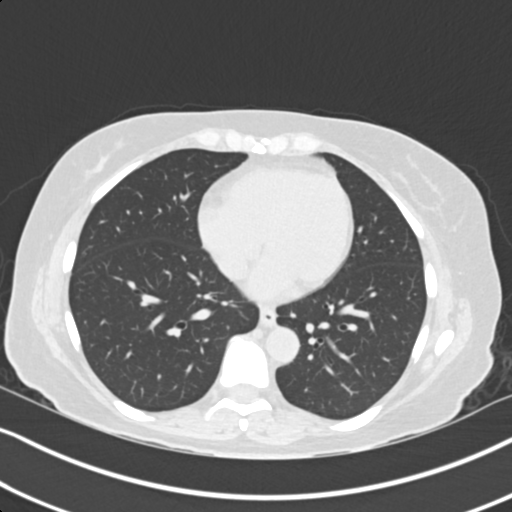
[im 92/166  mediastinal]
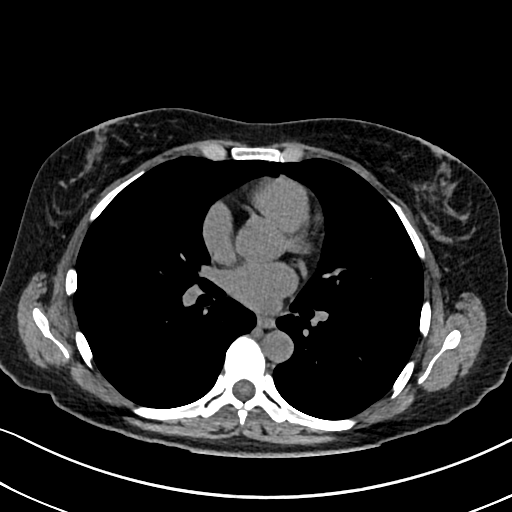
[im 92/166  lung]
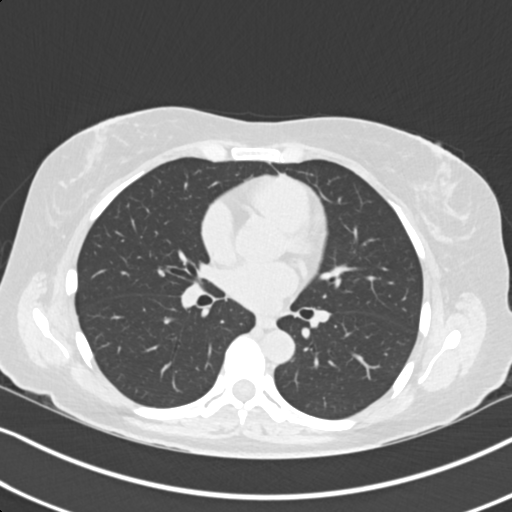
[im 111/166  lung]
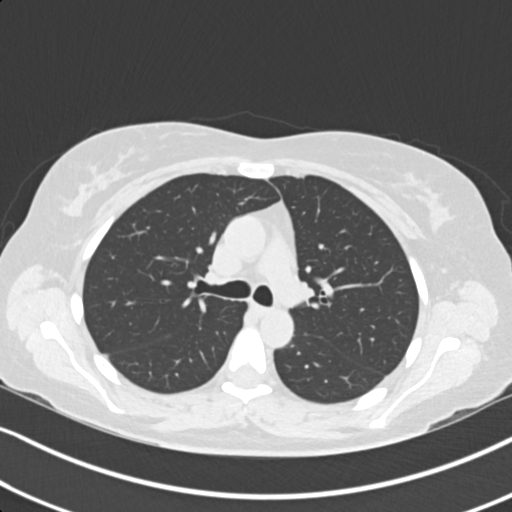
[im 129/166  lung]
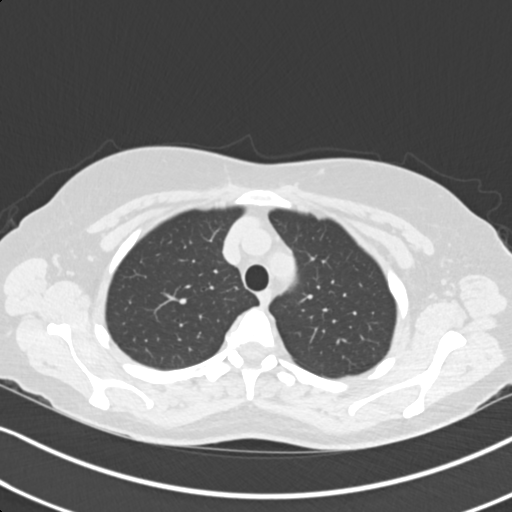
[im 147/166  lung]
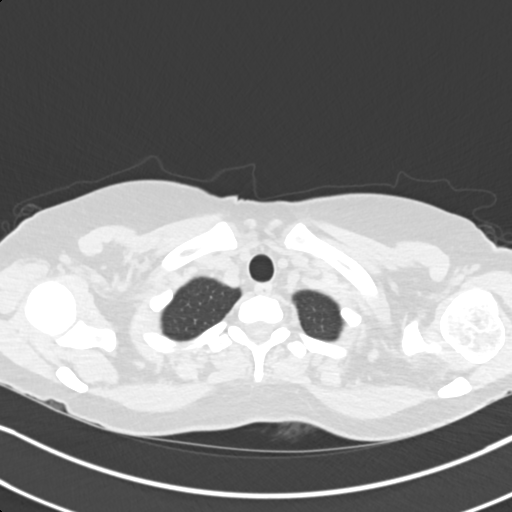

[Series 10: chest 2.00 br40 s3 · coronal · 0.65mm/px · 2 of 173 slices shown (2 of 2)]
[im 58/173  lung]
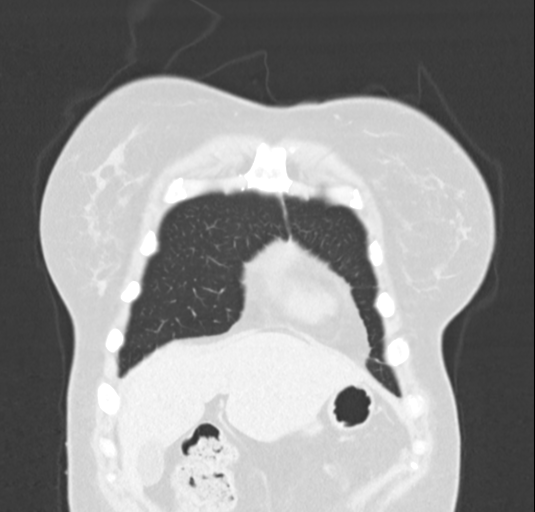
[im 115/173  lung]
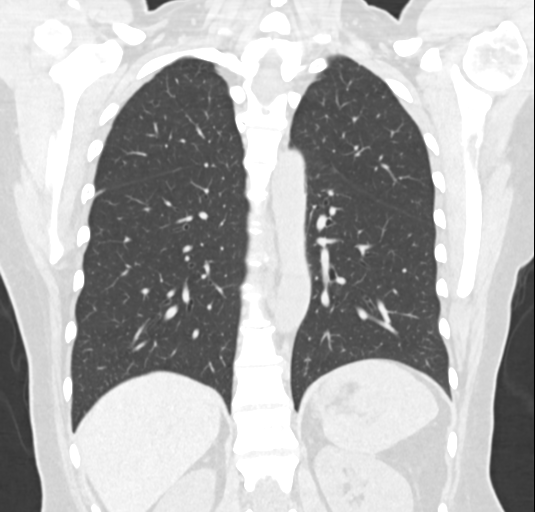

[Series 14: chest 2.00 br60 s3 · axial · 0.68mm/px · z∈[+1656,+1876]mm · 7 of 166 slices shown]
[im 19/166  lung]
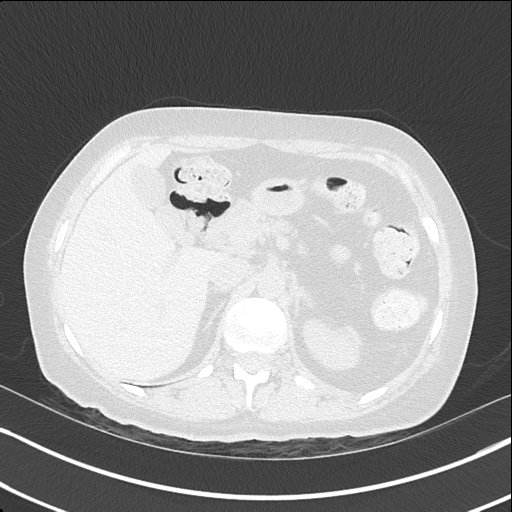
[im 37/166  lung]
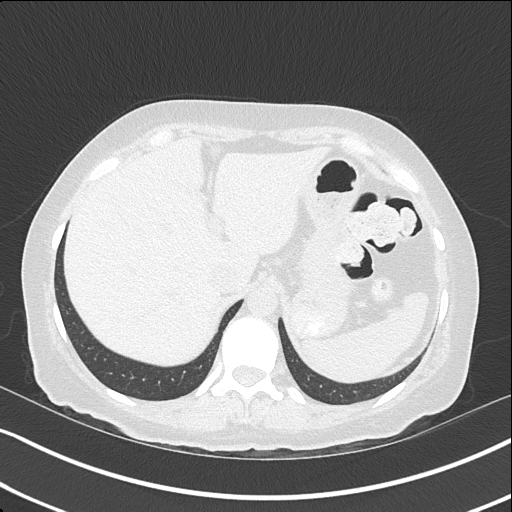
[im 56/166  lung]
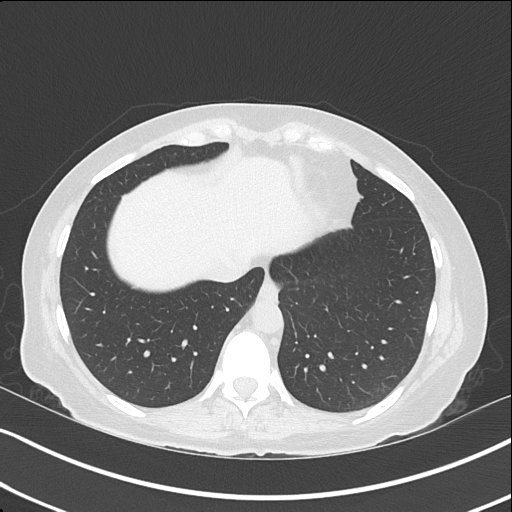
[im 74/166  lung]
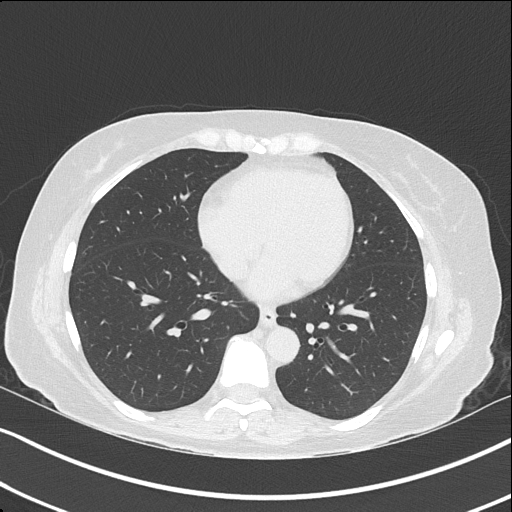
[im 92/166  lung]
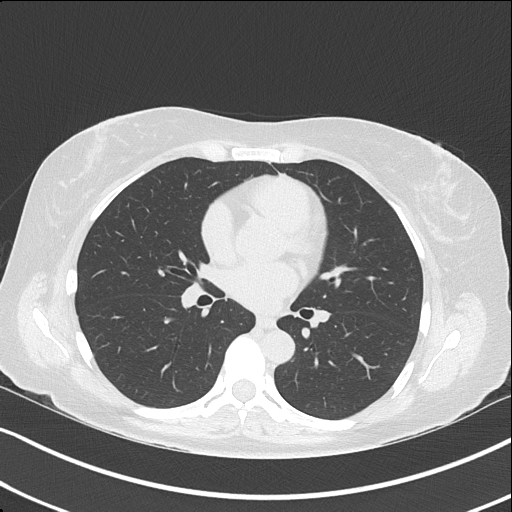
[im 111/166  lung]
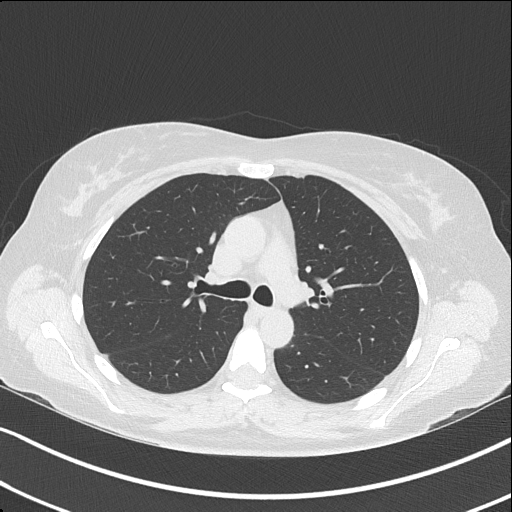
[im 129/166  lung]
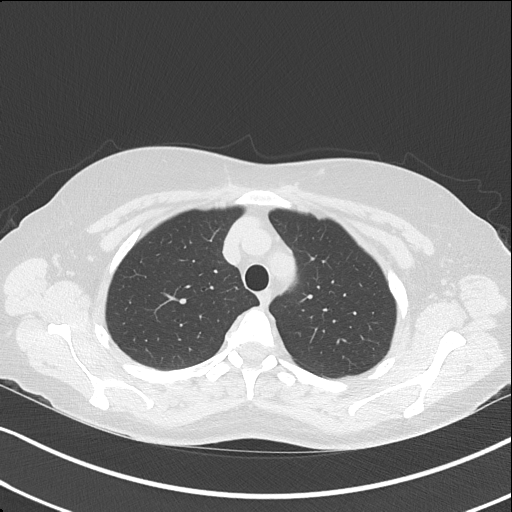

[17 of 36 positions shown; findings below may reference images not displayed]

FINDINGS: Cardiovascular: No significant vascular findings. Normal heart size.
No pericardial effusion. No thoracic aortic aneurysm. Mild
atherosclerotic calcification of the aortic arch.

Mediastinum/Nodes: No enlarged mediastinal or axillary lymph nodes.
Thyroid gland, trachea, and esophagus demonstrate no significant
findings.

Lungs/Pleura: Lungs are clear. No pleural effusion or pneumothorax.

Upper Abdomen: No acute abnormality.

Musculoskeletal: Skin marker placed along the right anterior aspect
of the proximal sternal body. No underlying soft tissue mass or
fluid collection. The sternoclavicular joints are normal and
symmetric in appearance without effusion, erosion, or significant
degenerative change. No acute or significant osseous findings.
IMPRESSION: 1. Normal CT appearance of the sternoclavicular joints. No
underlying soft tissue mass, fluid collection, or other finding to
account for the palpable abnormality.
2. Aortic Atherosclerosis ([id number]).

## 2023-11-16 ENCOUNTER — Other Ambulatory Visit: Payer: Self-pay | Admitting: Internal Medicine

## 2023-11-16 DIAGNOSIS — Z1231 Encounter for screening mammogram for malignant neoplasm of breast: Secondary | ICD-10-CM

## 2024-11-04 ENCOUNTER — Institutional Professional Consult (permissible substitution): Admitting: Diagnostic Neuroimaging
